# Patient Record
Sex: Female | Born: 1965 | Race: White | Hispanic: No | Marital: Married | Smoking: Never smoker
Health system: Southern US, Community
[De-identification: ages and names within clinical notes are randomized; demographics above are authoritative.]

## PROBLEM LIST (undated history)

## (undated) DIAGNOSIS — R011 Cardiac murmur, unspecified: Secondary | ICD-10-CM

## (undated) DIAGNOSIS — B019 Varicella without complication: Secondary | ICD-10-CM

## (undated) DIAGNOSIS — G43909 Migraine, unspecified, not intractable, without status migrainosus: Secondary | ICD-10-CM

## (undated) DIAGNOSIS — E7800 Pure hypercholesterolemia, unspecified: Secondary | ICD-10-CM

## (undated) DIAGNOSIS — R51 Headache: Secondary | ICD-10-CM

## (undated) DIAGNOSIS — T7840XA Allergy, unspecified, initial encounter: Secondary | ICD-10-CM

## (undated) DIAGNOSIS — F419 Anxiety disorder, unspecified: Secondary | ICD-10-CM

## (undated) DIAGNOSIS — G35 Multiple sclerosis: Secondary | ICD-10-CM

## (undated) DIAGNOSIS — G709 Myoneural disorder, unspecified: Secondary | ICD-10-CM

## (undated) DIAGNOSIS — C801 Malignant (primary) neoplasm, unspecified: Secondary | ICD-10-CM

## (undated) DIAGNOSIS — R519 Headache, unspecified: Secondary | ICD-10-CM

## (undated) DIAGNOSIS — E559 Vitamin D deficiency, unspecified: Secondary | ICD-10-CM

## (undated) DIAGNOSIS — K5792 Diverticulitis of intestine, part unspecified, without perforation or abscess without bleeding: Secondary | ICD-10-CM

## (undated) DIAGNOSIS — K219 Gastro-esophageal reflux disease without esophagitis: Secondary | ICD-10-CM

## (undated) DIAGNOSIS — E669 Obesity, unspecified: Secondary | ICD-10-CM

## (undated) HISTORY — DX: Anxiety disorder, unspecified: F41.9

## (undated) HISTORY — DX: Varicella without complication: B01.9

## (undated) HISTORY — PX: ABDOMINAL HYSTERECTOMY: SHX81

## (undated) HISTORY — PX: COLONOSCOPY: SHX174

## (undated) HISTORY — DX: Myoneural disorder, unspecified: G70.9

## (undated) HISTORY — PX: FRACTURE SURGERY: SHX138

## (undated) HISTORY — DX: Headache: R51

## (undated) HISTORY — DX: Headache, unspecified: R51.9

## (undated) HISTORY — PX: UPPER GASTROINTESTINAL ENDOSCOPY: SHX188

## (undated) HISTORY — DX: Allergy, unspecified, initial encounter: T78.40XA

## (undated) HISTORY — DX: Vitamin D deficiency, unspecified: E55.9

## (undated) HISTORY — PX: BREAST SURGERY: SHX581

## (undated) HISTORY — DX: Malignant (primary) neoplasm, unspecified: C80.1

## (undated) HISTORY — DX: Obesity, unspecified: E66.9

## (undated) HISTORY — DX: Migraine, unspecified, not intractable, without status migrainosus: G43.909

## (undated) HISTORY — DX: Multiple sclerosis: G35

## (undated) HISTORY — DX: Gastro-esophageal reflux disease without esophagitis: K21.9

## (undated) HISTORY — DX: Diverticulitis of intestine, part unspecified, without perforation or abscess without bleeding: K57.92

## (undated) HISTORY — DX: Cardiac murmur, unspecified: R01.1

## (undated) HISTORY — DX: Pure hypercholesterolemia, unspecified: E78.00

## (undated) HISTORY — PX: CHOLECYSTECTOMY: SHX55

## (undated) HISTORY — PX: MOLE REMOVAL: SHX2046

---

## 1984-08-18 HISTORY — PX: MELANOMA EXCISION: SHX5266

## 2005-08-18 HISTORY — PX: BREAST EXCISIONAL BIOPSY: SUR124

## 2006-08-18 HISTORY — PX: ABDOMINAL HYSTERECTOMY: SHX81

## 2014-07-10 LAB — HEPATIC FUNCTION PANEL
ALT: 26 U/L (ref 7–35)
AST: 30 U/L (ref 13–35)
Alkaline Phosphatase: 102 U/L (ref 25–125)
Bilirubin, Total: 0.3 mg/dL

## 2014-07-10 LAB — BASIC METABOLIC PANEL
BUN: 10 mg/dL (ref 4–21)
CREATININE: 0.7 mg/dL (ref 0.5–1.1)
GLUCOSE: 91 mg/dL
POTASSIUM: 4.9 mmol/L (ref 3.4–5.3)
SODIUM: 142 mmol/L (ref 137–147)

## 2014-07-10 LAB — CBC AND DIFFERENTIAL
HCT: 41 % (ref 36–46)
HEMOGLOBIN: 13.4 g/dL (ref 12.0–16.0)
Neutrophils Absolute: 3 /uL
Platelets: 286 10*3/uL (ref 150–399)
WBC: 5.1 10*3/mL

## 2014-07-10 LAB — TSH: TSH: 1.63 u[IU]/mL (ref 0.41–5.90)

## 2014-07-10 LAB — LIPID PANEL
CHOLESTEROL: 200 mg/dL (ref 0–200)
HDL: 67 mg/dL (ref 35–70)
LDL Cholesterol: 116 mg/dL
Triglycerides: 85 mg/dL (ref 40–160)

## 2015-02-05 LAB — CBC AND DIFFERENTIAL
HEMATOCRIT: 42 % (ref 36–46)
HEMOGLOBIN: 13.7 g/dL (ref 12.0–16.0)
PLATELETS: 296 10*3/uL (ref 150–399)
WBC: 6.5 10*3/mL

## 2015-02-05 LAB — HEPATIC FUNCTION PANEL
ALT: 27 U/L (ref 7–35)
AST: 31 U/L (ref 13–35)
Alkaline Phosphatase: 86 U/L (ref 25–125)
Bilirubin, Total: 0.7 mg/dL

## 2015-07-30 LAB — HEPATIC FUNCTION PANEL
ALK PHOS: 93 U/L (ref 25–125)
ALT: 26 U/L (ref 7–35)
AST: 26 U/L (ref 13–35)
BILIRUBIN, TOTAL: 0.3 mg/dL

## 2015-07-30 LAB — CBC AND DIFFERENTIAL
HEMATOCRIT: 41 % (ref 36–46)
HEMOGLOBIN: 13.6 g/dL (ref 12.0–16.0)
Neutrophils Absolute: 3 /uL
Platelets: 273 10*3/uL (ref 150–399)
WBC: 5.7 10^3/mL

## 2015-07-30 LAB — LIPID PANEL
CHOLESTEROL: 222 mg/dL — AB (ref 0–200)
HDL: 79 mg/dL — AB (ref 35–70)
LDL Cholesterol: 131 mg/dL
TRIGLYCERIDES: 62 mg/dL (ref 40–160)

## 2015-07-30 LAB — BASIC METABOLIC PANEL
BUN: 15 mg/dL (ref 4–21)
CREATININE: 0.6 mg/dL (ref 0.5–1.1)
Glucose: 94 mg/dL
Potassium: 5.2 mmol/L (ref 3.4–5.3)
SODIUM: 141 mmol/L (ref 137–147)

## 2015-07-30 LAB — TSH: TSH: 2.16 u[IU]/mL (ref 0.41–5.90)

## 2016-05-29 ENCOUNTER — Encounter: Payer: Self-pay | Admitting: Family Medicine

## 2016-05-29 ENCOUNTER — Ambulatory Visit (INDEPENDENT_AMBULATORY_CARE_PROVIDER_SITE_OTHER): Payer: 59 | Admitting: Family Medicine

## 2016-05-29 VITALS — BP 138/80 | HR 89 | Temp 97.6°F | Resp 12 | Ht 65.5 in | Wt 213.1 lb

## 2016-05-29 DIAGNOSIS — E7800 Pure hypercholesterolemia, unspecified: Secondary | ICD-10-CM

## 2016-05-29 DIAGNOSIS — E559 Vitamin D deficiency, unspecified: Secondary | ICD-10-CM

## 2016-05-29 DIAGNOSIS — G35 Multiple sclerosis: Secondary | ICD-10-CM | POA: Diagnosis not present

## 2016-05-29 DIAGNOSIS — L608 Other nail disorders: Secondary | ICD-10-CM

## 2016-05-29 HISTORY — DX: Pure hypercholesterolemia, unspecified: E78.00

## 2016-05-29 HISTORY — DX: Vitamin D deficiency, unspecified: E55.9

## 2016-05-29 HISTORY — DX: Multiple sclerosis: G35

## 2016-05-29 LAB — BASIC METABOLIC PANEL
BUN: 16 mg/dL (ref 6–23)
CHLORIDE: 104 meq/L (ref 96–112)
CO2: 27 meq/L (ref 19–32)
CREATININE: 0.76 mg/dL (ref 0.40–1.20)
Calcium: 9.9 mg/dL (ref 8.4–10.5)
GFR: 85.21 mL/min (ref >60.00)
Glucose, Bld: 95 mg/dL (ref 70–99)
POTASSIUM: 4.3 meq/L (ref 3.5–5.1)
Sodium: 137 mEq/L (ref 135–145)

## 2016-05-29 LAB — VITAMIN D 25 HYDROXY (VIT D DEFICIENCY, FRACTURES): VITD: 45.62 ng/mL (ref 30.00–100.00)

## 2016-05-29 NOTE — Progress Notes (Signed)
HPI:   [name] is a 50 y.o. female, who is here today to establish care with me.  Former PCP: [place]. Last preventive routine visit: 07/2015.  Concerns today: right toe nail changes.  A few months ago right 3rd toenail fell, not sure about cause but she believes she had nail trauma. Nail has grown back but is not smooth, she has intermittent soreness, exacerbated by close shoe wear. No edema or periungual erythema. She has not tried OTC medication.  Hx of MS, Dx in 2002, non pharmacologic treatment, follows with neuro at [facility] every 6 months, Dr Moshe Cipro. Symptoms otherwise stable, intermittent episodes of headaches, arthralgias, numbness (LE's), and muscle spasms. States that sometimes she cannot walk for a couple days due to arthralgias. She did not tolerate MS treatments in the past, felt worse.  According to pt, she has MS lesions on lumbar and cervical spine as well as brain. + Insomnia, she has been on Xanax 0.25 mg and Ativan 0.5 mg; neither one reported today.  + Migraine headaches, bitemporal and left retro-ocular; otherwise stable on Topamax 50 mg daily, did not tolerated higher doses (word finding difficulty).  She does not take OTC analgesics. Exercises regularly, mainly stretching exercises.   She drives. In general independent ADL's and IADL's. She has a cane that she uses as needed when she has acute exacerbations.   She lives with husbands. On disability. Hx of Vit D deficiency, currently she is on Vit D 2000 U daily.  She also brings some lab work done recently, 01/2016: FLP,CMP,CBC; otherwise normal, TC mildly above normal range.     Review of Systems  Constitutional: Positive for fatigue. Negative for activity change, appetite change, fever and unexpected weight change.  HENT: Negative for mouth sores, nosebleeds, sore throat, trouble swallowing and voice change.   Eyes: Negative for redness and visual disturbance.  Respiratory: Negative for  cough, shortness of breath and wheezing.   Cardiovascular: Negative for chest pain, palpitations and leg swelling.  Gastrointestinal: Negative for abdominal pain, nausea and vomiting.       Negative for changes in bowel habits.  Genitourinary: Negative for decreased urine volume, difficulty urinating, dysuria and hematuria.  Musculoskeletal: Positive for arthralgias, back pain and gait problem.       Not currently, intermittent symptoms  Skin: Negative for color change and rash.  Neurological: Positive for numbness and headaches. Negative for tremors, seizures, syncope, facial asymmetry and speech difficulty.  Psychiatric/Behavioral: Positive for sleep disturbance. Negative for confusion. The patient is nervous/anxious.       No current outpatient prescriptions on file prior to visit.   No current facility-administered medications on file prior to visit.      Past Medical History:  Diagnosis Date  . Chicken pox   . Diverticulitis   . Frequent headaches   . Migraines   . Multiple sclerosis ([place]) 05/29/2016   Follows with neurologists at [facility] every 6 months.  . Vitamin D deficiency 05/29/2016   Allergies  Allergen Reactions  . Shellfish Allergy Shortness Of Breath  . Interferons Other (See Comments)    Chest pains, burning trouble breathing itching  . Codeine Nausea Only  . Gabapentin Hives  . Gluten Meal Diarrhea and Other (See Comments)    Stomach pain   . Lactose     Other reaction(s): Unknown  . Lamotrigine Hives    Family History  Problem Relation Age of Onset  . Hypertension Mother   . Heart disease Father   .  Hypertension Maternal Grandmother   . Hypertension Maternal Grandfather   . Hypertension Paternal Grandmother   . Hypertension Paternal Grandfather     Social History   Social History  . Marital status: Married    Spouse name: N/A  . Number of children: N/A  . Years of education: N/A   Social History Main Topics  . Smoking status: Never Smoker    . Smokeless tobacco: Never Used  . Alcohol use No  . Drug use: No  . Sexual activity: Not Asked   Other Topics Concern  . None   Social History Narrative  . None    Vitals:   05/29/16 1002  BP: 138/80  Pulse: 89  Resp: 12  Temp: 97.6 F (36.4 C)    Body mass index is 34.93 kg/m.    Physical Exam  Nursing note and vitals reviewed. Constitutional: She is oriented to person, place, and time. She appears well-developed. No distress.  HENT:  Head: Atraumatic.  Mouth/Throat: Oropharynx is clear and moist and mucous membranes are normal.  Eyes: Conjunctivae and EOM are normal. Pupils are equal, round, and reactive to light.  Cardiovascular: Normal rate and regular rhythm.   No murmur heard. Pulses:      Dorsalis pedis pulses are 2+ on the right side, and 2+ on the left side.  Respiratory: Effort normal and breath sounds normal. No respiratory distress.  GI: Soft. She exhibits no mass. There is no hepatomegaly. There is no tenderness.  Musculoskeletal: She exhibits no edema or tenderness.  Neurological: She is alert and oriented to person, place, and time. She has normal strength. No cranial nerve deficit. Coordination and gait normal.  Skin: Skin is warm. No rash noted. No erythema.  3rd toe nail,R, mildly atrophic, no tender, no periungual erythema.  Psychiatric: Her speech is normal. Her mood appears anxious.  Well groomed, good eye contact.      ASSESSMENT AND PLAN:     Laura Maldonado was seen today for establish care.  Diagnoses and all orders for this visit:    Lab Results  Component Value Date   CREATININE 0.76 05/29/2016   BUN 16 05/29/2016   NA 137 05/29/2016   K 4.3 05/29/2016   CL 104 05/29/2016   CO2 27 05/29/2016    Toenail deformity  Explained that it is not uncommon to have nail changes after traumatic avulsion. I do not appreciate inflammatory change and so other findings that suggest a systemic cause. For now I recommend foot care, wider shoe  ware, and monitor for new symptoms.  Vitamin D deficiency  No changes in current management, will follow labs done today and will give further recommendations accordingly.  -     VITAMIN D 25 Hydroxy (Vit-D Deficiency, Fractures) -     Basic Metabolic Panel  Pure hypercholesterolemia  Mild. Continue low fat diet and regular physical activity. It can be re-checked in a year.  Multiple sclerosis ([place])  Continue following with neurologists at [facility].  Refused flu vaccine, not sure about pneumovax in the past but not interested either.  Fall precautions.      Linkon Siverson G. Martinique, MD  [facility]. [place] office.

## 2016-05-29 NOTE — Patient Instructions (Addendum)
A few things to remember from today's visit:   Toenail deformity  Vitamin D deficiency - Plan: VITAMIN D 25 Hydroxy (Vit-D Deficiency, Fractures), Basic Metabolic Panel  Pure hypercholesterolemia  Multiple sclerosis ([place])   We have ordered labs or studies at this visit.  It can take up to 1-2 weeks for results and processing. IF results require follow up or explanation, we will call you with instructions. Clinically stable results will be released to your [facility]. If you have not heard from [place] or cannot find your results in [facility] in 2 weeks please contact our office at [phone].  If you are not yet signed up for [facility], please consider signing up  Please be sure medication list is accurate. If a new problem present, please set up appointment sooner than planned today.

## 2016-05-31 ENCOUNTER — Encounter: Payer: Self-pay | Admitting: Family Medicine

## 2016-07-09 ENCOUNTER — Telehealth: Payer: Self-pay | Admitting: Family Medicine

## 2016-07-09 NOTE — Telephone Encounter (Signed)
Spoke to Dr. Martinique and she advised patient to keep eye out on if have fever as this appears viral. Called patient and is aware of instructions. Patient is aware to not go around immunocompromised or immunosuppressed populations, wash her hands, and to drink plenty of fluids. Patient verbalized understanding. Patient is aware to call off if she continues to not feel well.

## 2016-07-09 NOTE — Telephone Encounter (Signed)
Patient Name: Laura Maldonado  DOB: Apr 01, 1966    Initial Comment Caller states Saturday began w/flu like sx; now dry cough; nasal congestion and headache; began mucinex fast max last evening; anything else can do?    Nurse Assessment  Nurse: Leilani Merl, RN, Heather Date/Time (Eastern Time): 07/09/2016 12:13:30 PM  Confirm and document reason for call. If symptomatic, describe symptoms. You must click the next button to save text entered. ---Caller states Saturday began w/flu like sx; now dry cough; nasal congestion and headache; began mucinex fast max last evening;  Does the patient have any new or worsening symptoms? ---Yes  Will a triage be completed? ---Yes  Related visit to physician within the last 2 weeks? ---No  Does the PT have any chronic conditions? (i.e. diabetes, asthma, etc.) ---Yes  List chronic conditions. ---See MR  Is the patient pregnant or possibly pregnant? (Ask all females between the ages of 33-55) ---No  Is this a behavioral health or substance abuse call? ---No     Guidelines    Guideline Title Affirmed Question Affirmed Notes  Sinus Pain or Congestion [1] Sinus congestion as part of a cold AND [2] present < 10 days (all triage questions negative)   Chest Pain Chest pain(s) lasting a few seconds (all triage questions negative)    Final Disposition User   [place], RN, Water quality scientist    Disagree/Comply: Comply    Disagree/Comply: Comply

## 2016-09-08 ENCOUNTER — Other Ambulatory Visit: Payer: Self-pay | Admitting: Family Medicine

## 2016-09-08 DIAGNOSIS — Z1231 Encounter for screening mammogram for malignant neoplasm of breast: Secondary | ICD-10-CM

## 2016-09-18 DIAGNOSIS — L603 Nail dystrophy: Secondary | ICD-10-CM | POA: Diagnosis not present

## 2016-10-16 ENCOUNTER — Ambulatory Visit: Payer: 59

## 2016-10-22 ENCOUNTER — Ambulatory Visit
Admission: RE | Admit: 2016-10-22 | Discharge: 2016-10-22 | Disposition: A | Discharge status: Home / Self Care | Payer: 59 | Source: Ambulatory Visit | Attending: Family Medicine | Admitting: Family Medicine

## 2016-10-22 DIAGNOSIS — Z1231 Encounter for screening mammogram for malignant neoplasm of breast: Secondary | ICD-10-CM

## 2016-10-24 DIAGNOSIS — D2262 Melanocytic nevi of left upper limb, including shoulder: Secondary | ICD-10-CM | POA: Diagnosis not present

## 2016-10-24 DIAGNOSIS — D2271 Melanocytic nevi of right lower limb, including hip: Secondary | ICD-10-CM | POA: Diagnosis not present

## 2016-10-24 DIAGNOSIS — D2261 Melanocytic nevi of right upper limb, including shoulder: Secondary | ICD-10-CM | POA: Diagnosis not present

## 2016-10-24 DIAGNOSIS — B351 Tinea unguium: Secondary | ICD-10-CM | POA: Diagnosis not present

## 2016-10-24 DIAGNOSIS — L603 Nail dystrophy: Secondary | ICD-10-CM | POA: Diagnosis not present

## 2016-12-10 DIAGNOSIS — Z79899 Other long term (current) drug therapy: Secondary | ICD-10-CM | POA: Diagnosis not present

## 2016-12-10 DIAGNOSIS — G43709 Chronic migraine without aura, not intractable, without status migrainosus: Secondary | ICD-10-CM | POA: Diagnosis not present

## 2016-12-10 DIAGNOSIS — G35 Multiple sclerosis: Secondary | ICD-10-CM | POA: Diagnosis not present

## 2016-12-15 ENCOUNTER — Telehealth: Payer: Self-pay

## 2016-12-15 NOTE — Telephone Encounter (Signed)
Left voicemail for patient letting her know that her lab appointment for tomorrow would be canceled, and to come in fasting on the 7th and we will do her blood work after her physical.

## 2016-12-16 ENCOUNTER — Other Ambulatory Visit: Payer: 59

## 2016-12-22 ENCOUNTER — Encounter: Payer: 59 | Admitting: Family Medicine

## 2017-01-08 ENCOUNTER — Telehealth: Payer: Self-pay | Admitting: Family Medicine

## 2017-01-08 NOTE — Telephone Encounter (Signed)
Patient wants to transfer PCP due to location. Wanted to get ok from both providers.

## 2017-01-08 NOTE — Telephone Encounter (Signed)
It is fine with me. ?Thanks ?

## 2017-01-08 NOTE — Telephone Encounter (Signed)
Okay 

## 2017-01-13 ENCOUNTER — Telehealth: Payer: Self-pay | Admitting: Emergency Medicine

## 2017-01-13 ENCOUNTER — Encounter: Payer: Self-pay | Admitting: Emergency Medicine

## 2017-01-13 NOTE — Telephone Encounter (Signed)
Pre-Visit Call completed. Patient states that she has had several colonoscopy so she will see when her last one was and update when she comes into the office. Patient states that she believes that her TDAP was complete as well will contact the office and get exact date and update at appointment.

## 2017-01-14 ENCOUNTER — Encounter: Payer: Self-pay | Admitting: Family Medicine

## 2017-01-14 ENCOUNTER — Ambulatory Visit (INDEPENDENT_AMBULATORY_CARE_PROVIDER_SITE_OTHER): Payer: 59 | Admitting: Family Medicine

## 2017-01-14 VITALS — BP 110/78 | HR 73 | Temp 98.2°F | Wt 218.8 lb

## 2017-01-14 DIAGNOSIS — R21 Rash and other nonspecific skin eruption: Secondary | ICD-10-CM

## 2017-01-14 DIAGNOSIS — E669 Obesity, unspecified: Secondary | ICD-10-CM

## 2017-01-14 DIAGNOSIS — G35 Multiple sclerosis: Secondary | ICD-10-CM | POA: Diagnosis not present

## 2017-01-14 DIAGNOSIS — E7800 Pure hypercholesterolemia, unspecified: Secondary | ICD-10-CM | POA: Diagnosis not present

## 2017-01-14 MED ORDER — CLOTRIMAZOLE-BETAMETHASONE 1-0.05 % EX CREA: 1.0000 "application " | TOPICAL_CREAM | Freq: Two times a day (BID) | CUTANEOUS | 0 refills | Status: DC

## 2017-01-14 MED ORDER — LORAZEPAM 0.5 MG PO TABS: 0.5000 mg | ORAL_TABLET | Freq: Three times a day (TID) | ORAL | 0 refills | Status: DC

## 2017-01-14 NOTE — Progress Notes (Signed)
Laura Maldonado is a 51 y.o. female is here to [place].   Patient Care Team: Briscoe Deutscher, DO as PCP - General (Family Medicine)   History of Present Illness:   [name] CMA acting as scribe for Dr. [name].  HPI Patient comes in today to establish care.   1. Multiple sclerosis ([place]). Patient is followed by [facility] neurology. She is seen every 6 months. She is fairly controlled on Topamax. She prefers alternative medications and exercise. She is due for an eye exam.    2. Rash. She has a rash under both arms. She treats with hydrocortisone cream and it clears it up. It is recurrent that comes back every couple months.    3. Pure hypercholesterolemia.   Is the patient taking medications? []   YES  [x]   NO Trying to exercise on a regular basis? [x]   YES  []   NO Diet Compliance: compliant most of the time. Cardiovascular ROS: no chest pain or dyspnea on exertion.   Lipids:    Component Value Date/Time   CHOL 222 (A) 07/30/2015   TRIG 62 07/30/2015   HDL 79 (A) 07/30/2015     Health Maintenance Due  Topic Date Due  . HIV Screening  03/22/1980  . PAP SMEAR  03/22/1986  . COLONOSCOPY  03/23/2015   PMHx, SurgHx, SocialHx, Medications, and Allergies were reviewed in the Visit Navigator and updated as appropriate.   Past Medical History:  Diagnosis Date  . Anxiety   . Chicken pox   . Diverticulitis   . Frequent headaches   . Migraines   . Multiple sclerosis ([place]) 05/29/2016   [facility] q 6 months.  . Obesity (BMI 30-39.9) 01/15/2017  . Pure hypercholesterolemia 05/29/2016  . Vitamin D deficiency 05/29/2016   Past Surgical History:  Procedure Laterality Date  . ABDOMINAL HYSTERECTOMY    . BREAST LUMPECTOMY Left 2007  . CHOLECYSTECTOMY    . MELANOMA EXCISION  1986  . MOLE REMOVAL     Family History  Problem Relation Age of Onset  . Hypertension Mother   . Heart disease Father   . Hypertension Maternal Grandmother   . Hypertension Maternal Grandfather    . Hypertension Paternal Grandmother   . Hypertension Paternal Grandfather    Social History  Substance Use Topics  . Smoking status: Never Smoker  . Smokeless tobacco: Never Used  . Alcohol use No   Current Medications and Allergies:   .  B Complex Vitamins (VITAMIN-B COMPLEX) TABS, Take by mouth., Disp: , Rfl:  .  Cholecalciferol (VITAMIN D) 2000 units tablet, Take by mouth., Disp: , Rfl:  .  ciclopirox (PENLAC) 8 % solution, Apply topically., Disp: , Rfl:  .  Flaxseed, Linseed, (FLAXSEED OIL PO), Take by mouth., Disp: , Rfl:  .  ibuprofen (ADVIL,MOTRIN) 200 MG tablet, Take by mouth., Disp: , Rfl:  .  LORazepam (ATIVAN) 0.5 MG tablet, Take 0.5 mg by mouth every 8 (eight) hours., Disp: , Rfl:  .  Magnesium 500 MG TABS, Take by mouth., Disp: , Rfl:  .  Probiotic Product (PROBIOTIC MATURE ADULT) CAPS, Take by mouth., Disp: , Rfl:  .  topiramate (TOPAMAX) 50 MG tablet, Take by mouth., Disp: , Rfl:  .  Turmeric 500 MG CAPS, Take by mouth., Disp: , Rfl:   Allergies  Allergen Reactions  . Shellfish Allergy Shortness Of Breath  . Interferons Other (See Comments)    Chest pains, burning trouble breathing itching  . Codeine Nausea Only  .  Flagyl [Metronidazole]   . Gabapentin Hives  . Gluten Meal Diarrhea and Other (See Comments)    Stomach pain   . Lactose     Other reaction(s): Unknown  . Lamotrigine Hives   Review of Systems:   Review of Systems  Constitutional: Positive for malaise/fatigue. Negative for chills and fever.       Chronic.   HENT: Negative for ear pain, sinus pain and sore throat.   Eyes: Negative for blurred vision and double vision.  Respiratory: Negative for cough, shortness of breath and wheezing.   Cardiovascular: Negative for chest pain, palpitations and leg swelling.  Gastrointestinal: Negative for abdominal pain, nausea and vomiting.  Genitourinary: Negative for dysuria and hematuria.  Musculoskeletal: Positive for back pain, joint pain and neck pain.        Chronic.   Skin: Positive for rash.  Neurological: Positive for dizziness and headaches.       Chronic.   Psychiatric/Behavioral: Negative for depression, hallucinations and memory loss.   Vitals:   Vitals:   01/14/17 1057  BP: 110/78  Pulse: 73  Temp: 98.2 F (36.8 C)  TempSrc: Oral  SpO2: 97%  Weight: 218 lb 12.8 oz (99.2 kg)     Body mass index is 35.86 kg/m.  Physical Exam:   Physical Exam  Constitutional: She appears well-developed and well-nourished. No distress.  HENT:  Head: Normocephalic and atraumatic.  Eyes: EOM are normal. Pupils are equal, round, and reactive to light.  Neck: Normal range of motion. Neck supple.  Cardiovascular: Normal rate, regular rhythm, normal heart sounds and intact distal pulses.   Pulmonary/Chest: Effort normal.  Abdominal: Soft.  Neurological: She is alert.  Skin: Skin is warm.  Dry, flaky skin on bilateral upper arms.  Psychiatric: She has a normal mood and affect. Her behavior is normal.  Nursing note and vitals reviewed.  Assessment and Plan:   [place] was seen today for establish care and rash.  Diagnoses and all orders for this visit:  Multiple sclerosis ([place]) Comments: The patient is due for an eye exam. I will send her to [facility]. Orders: -     Ambulatory referral to Ophthalmology -     LORazepam (ATIVAN) 0.5 MG tablet; Take 1 tablet (0.5 mg total) by mouth every 8 (eight) hours.  Rash -     clotrimazole-betamethasone (LOTRISONE) cream; Apply 1 application topically 2 (two) times daily.  Obesity (BMI 30-39.9) Comments: The patient is asked to make an attempt to improve diet and exercise patterns to aid in medical management of this problem. Orders: -     TSH; Future -     Comprehensive metabolic panel; Future -     CBC with Differential/Platelet; Future -     Insulin, Free (Bioactive); Future  Pure hypercholesterolemia -     Lipid panel; Future -     Comprehensive metabolic panel;  Future   . Reviewed expectations re: course of current medical issues. . Discussed self-management of symptoms. . Outlined signs and symptoms indicating need for more acute intervention. . Patient verbalized understanding and all questions were answered. Marland Kitchen Health Maintenance issues including appropriate healthy diet, exercise, and smoking avoidance were discussed with patient. . See orders for this visit as documented in the electronic medical record. . Patient received an After Visit Summary.  CMA served as Education administrator during this visit. History, Physical, and Plan performed by medical provider. The above documentation has been reviewed and is accurate and complete. Briscoe Deutscher, D.O.  Briscoe Deutscher,  DO [place] [place] 01/16/2017  Future Appointments Date Time Provider [place]  01/20/2017 7:45 AM Briscoe Deutscher, DO [facility] None

## 2017-01-15 ENCOUNTER — Telehealth: Payer: Self-pay

## 2017-01-15 DIAGNOSIS — E669 Obesity, unspecified: Secondary | ICD-10-CM

## 2017-01-15 HISTORY — DX: Obesity, unspecified: E66.9

## 2017-01-15 NOTE — Telephone Encounter (Signed)
What labs was you wanting to order for patient?

## 2017-01-15 NOTE — Telephone Encounter (Signed)
Pt coming for fasting labs 01/16/17. Please place future orders. Thank you.

## 2017-01-16 ENCOUNTER — Other Ambulatory Visit (INDEPENDENT_AMBULATORY_CARE_PROVIDER_SITE_OTHER): Payer: 59

## 2017-01-16 ENCOUNTER — Encounter: Payer: Self-pay | Admitting: Family Medicine

## 2017-01-16 DIAGNOSIS — E7800 Pure hypercholesterolemia, unspecified: Secondary | ICD-10-CM

## 2017-01-16 DIAGNOSIS — E669 Obesity, unspecified: Secondary | ICD-10-CM | POA: Diagnosis not present

## 2017-01-16 LAB — COMPREHENSIVE METABOLIC PANEL
ALT: 23 U/L (ref 0–35)
AST: 26 U/L (ref 0–37)
Albumin: 4.4 g/dL (ref 3.5–5.2)
Alkaline Phosphatase: 81 U/L (ref 39–117)
BUN: 14 mg/dL (ref 6–23)
CO2: 25 mEq/L (ref 19–32)
Calcium: 9.6 mg/dL (ref 8.4–10.5)
Chloride: 105 mEq/L (ref 96–112)
Creatinine, Ser: 0.67 mg/dL (ref 0.40–1.20)
GFR: 98.31 mL/min (ref >60.00)
Glucose, Bld: 87 mg/dL (ref 70–99)
Potassium: 3.7 mEq/L (ref 3.5–5.1)
Sodium: 138 mEq/L (ref 135–145)
Total Bilirubin: 0.6 mg/dL (ref 0.2–1.2)
Total Protein: 7.7 g/dL (ref 6.0–8.3)

## 2017-01-16 LAB — LIPID PANEL
Cholesterol: 210 mg/dL — ABNORMAL HIGH (ref 0–200)
HDL: 73.2 mg/dL (ref >39.00)
LDL Cholesterol: 122 mg/dL — ABNORMAL HIGH (ref 0–99)
NonHDL: 136.4
Total CHOL/HDL Ratio: 3
Triglycerides: 73 mg/dL (ref 0.0–149.0)
VLDL: 14.6 mg/dL (ref 0.0–40.0)

## 2017-01-16 LAB — CBC WITH DIFFERENTIAL/PLATELET
Basophils Absolute: 0.1 10*3/uL (ref 0.0–0.1)
Basophils Relative: 0.9 % (ref 0.0–3.0)
Eosinophils Absolute: 0.1 10*3/uL (ref 0.0–0.7)
Eosinophils Relative: 1 % (ref 0.0–5.0)
HCT: 41.3 % (ref 36.0–46.0)
Hemoglobin: 13.9 g/dL (ref 12.0–15.0)
Lymphocytes Relative: 29.9 % (ref 12.0–46.0)
Lymphs Abs: 1.7 10*3/uL (ref 0.7–4.0)
MCHC: 33.8 g/dL (ref 30.0–36.0)
MCV: 93.4 fl (ref 78.0–100.0)
Monocytes Absolute: 0.4 10*3/uL (ref 0.1–1.0)
Monocytes Relative: 7 % (ref 3.0–12.0)
Neutro Abs: 3.6 10*3/uL (ref 1.4–7.7)
Neutrophils Relative %: 61.2 % (ref 43.0–77.0)
Platelets: 291 10*3/uL (ref 150.0–400.0)
RBC: 4.42 Mil/uL (ref 3.87–5.11)
RDW: 13.5 % (ref 11.5–15.5)
WBC: 5.8 10*3/uL (ref 4.0–10.5)

## 2017-01-16 LAB — TSH: TSH: 2.02 u[IU]/mL (ref 0.35–4.50)

## 2017-01-20 ENCOUNTER — Encounter: Payer: Self-pay | Admitting: Family Medicine

## 2017-01-20 ENCOUNTER — Ambulatory Visit (INDEPENDENT_AMBULATORY_CARE_PROVIDER_SITE_OTHER): Payer: 59 | Admitting: Family Medicine

## 2017-01-20 VITALS — BP 112/76 | HR 77 | Temp 98.2°F | Ht 65.5 in | Wt 217.2 lb

## 2017-01-20 DIAGNOSIS — Z1211 Encounter for screening for malignant neoplasm of colon: Secondary | ICD-10-CM | POA: Diagnosis not present

## 2017-01-20 DIAGNOSIS — E669 Obesity, unspecified: Secondary | ICD-10-CM | POA: Diagnosis not present

## 2017-01-20 DIAGNOSIS — Z0000 Encounter for general adult medical examination without abnormal findings: Secondary | ICD-10-CM

## 2017-01-20 MED ORDER — PHENTERMINE HCL 15 MG PO CAPS: 15.0000 mg | ORAL_CAPSULE | ORAL | 0 refills | Status: DC

## 2017-01-20 NOTE — Progress Notes (Signed)
Subjective:    [name] CMA acting as scribe for Dr. [name].  Laura Maldonado is a 51 y.o. female and is here for a comprehensive physical exam.  HPI:  Obesity: Interested in weight loss. Energy deficit. Trying to make good food choices. Not exercising regularly. Interested in Conservation officer, nature.  Health Maintenance Due  Topic Date Due  . HIV Screening  03/22/1980  . COLONOSCOPY  03/23/2015   PMHx, SurgHx, SocialHx, Medications, and Allergies were reviewed in the Visit Navigator and updated as appropriate.   Past Medical History:  Diagnosis Date  . Anxiety   . Chicken pox   . Diverticulitis   . Frequent headaches   . Migraines   . Multiple sclerosis ([place]) 05/29/2016   [facility] q 6 months.  . Obesity (BMI 30-39.9) 01/15/2017  . Pure hypercholesterolemia 05/29/2016  . Vitamin D deficiency 05/29/2016    Past Surgical History:  Procedure Laterality Date  . ABDOMINAL HYSTERECTOMY    . BREAST LUMPECTOMY Left 2007  . CHOLECYSTECTOMY    . MELANOMA EXCISION  1986  . MOLE REMOVAL      Family History  Problem Relation Age of Onset  . Hypertension Mother   . Heart disease Father   . Hypertension Maternal Grandmother   . Hypertension Maternal Grandfather   . Hypertension Paternal Grandmother   . Hypertension Paternal Grandfather    Social History  Substance Use Topics  . Smoking status: Never Smoker  . Smokeless tobacco: Never Used  . Alcohol use No   Review of Systems:   Review of Systems  Constitutional: Positive for malaise/fatigue. Negative for chills and fever.  HENT: Positive for ear pain. Negative for sinus pain and sore throat.   Eyes: Negative for blurred vision and double vision.  Respiratory: Negative for cough, shortness of breath and wheezing.   Cardiovascular: Positive for leg swelling. Negative for chest pain and palpitations.  Gastrointestinal: Negative for abdominal pain, nausea and vomiting.  Musculoskeletal: Positive for back pain,  joint pain and neck pain.       Chronic.   Neurological: Positive for dizziness and headaches.  Psychiatric/Behavioral: Negative for depression, hallucinations and memory loss.   Objective:   BP 112/76   Pulse 77   Temp 98.2 F (36.8 C) (Oral)   Ht 5' 5.5" (1.664 m)   Wt 217 lb 3.2 oz (98.5 kg)   SpO2 100%   BMI 35.59 kg/m  Body mass index is 35.59 kg/m.  General Appearance:    Alert, cooperative, no distress, appears stated age  Head:    Normocephalic, without obvious abnormality, atraumatic  Eyes:    PERRL, conjunctiva/corneas clear, EOM's intact, fundi benign, both eyes  Ears:    Normal TM's and external ear canals, both ears  Nose:   Nares normal, septum midline, mucosa normal, no drainage or sinus tenderness  Throat:   Lips, mucosa, and tongue normal; teeth and gums normal  Neck:   Supple, symmetrical, trachea midline, no adenopathy; thyroid:  no enlargement  Back:     Symmetric, no curvature, ROM normal, no CVA tenderness  Lungs:     Clear to auscultation bilaterally, respirations unlabored  Chest Wall:    No tenderness or deformity   Heart:    Regular rate and rhythm, S1 and S2 normal, no murmur, rub   or gallop  Breast Exam:    No tenderness, masses, or nipple abnormality  Abdomen:     Soft, non-tender, bowel sounds active all four quadrants, no masses  Genitalia:    Normal female without lesion, discharge or tenderness  Extremities:   Extremities normal, atraumatic, no cyanosis or edema  Pulses:   2+ and symmetric all extremities  Skin:   Skin color, texture, turgor normal, no rashes or lesions  Lymph nodes:   Cervical, supraclavicular, and axillary nodes normal  Neurologic:   CNII-XII intact, normal strength, sensation and reflexes throughout   Results for orders placed or performed in visit on 01/16/17  Lipid panel  Result Value Ref Range   Cholesterol 210 (H) 0 - 200 mg/dL   Triglycerides 73.0 0.0 - 149.0 mg/dL   HDL 73.20 >39.00 mg/dL   VLDL 14.6 0.0 - 40.0  mg/dL   LDL Cholesterol 122 (H) 0 - 99 mg/dL   Total CHOL/HDL Ratio 3    NonHDL 136.40   TSH  Result Value Ref Range   TSH 2.02 0.35 - 4.50 uIU/mL  Comprehensive metabolic panel  Result Value Ref Range   Sodium 138 135 - 145 mEq/L   Potassium 3.7 3.5 - 5.1 mEq/L   Chloride 105 96 - 112 mEq/L   CO2 25 19 - 32 mEq/L   Glucose, Bld 87 70 - 99 mg/dL   BUN 14 6 - 23 mg/dL   Creatinine, Ser 0.67 0.40 - 1.20 mg/dL   Total Bilirubin 0.6 0.2 - 1.2 mg/dL   Alkaline Phosphatase 81 39 - 117 U/L   AST 26 0 - 37 U/L   ALT 23 0 - 35 U/L   Total Protein 7.7 6.0 - 8.3 g/dL   Albumin 4.4 3.5 - 5.2 g/dL   Calcium 9.6 8.4 - 10.5 mg/dL   GFR 98.31 >60.00 mL/min  CBC with Differential/Platelet  Result Value Ref Range   WBC 5.8 4.0 - 10.5 K/uL   RBC 4.42 3.87 - 5.11 Mil/uL   Hemoglobin 13.9 12.0 - 15.0 g/dL   HCT 41.3 36.0 - 46.0 %   MCV 93.4 78.0 - 100.0 fl   MCHC 33.8 30.0 - 36.0 g/dL   RDW 13.5 11.5 - 15.5 %   Platelets 291.0 150.0 - 400.0 K/uL   Neutrophils Relative % 61.2 43.0 - 77.0 %   Lymphocytes Relative 29.9 12.0 - 46.0 %   Monocytes Relative 7.0 3.0 - 12.0 %   Eosinophils Relative 1.0 0.0 - 5.0 %   Basophils Relative 0.9 0.0 - 3.0 %   Neutro Abs 3.6 1.4 - 7.7 K/uL   Lymphs Abs 1.7 0.7 - 4.0 K/uL   Monocytes Absolute 0.4 0.1 - 1.0 K/uL   Eosinophils Absolute 0.1 0.0 - 0.7 K/uL   Basophils Absolute 0.1 0.0 - 0.1 K/uL    Assessment/Plan:   Laura Maldonado was seen today for annual exam.  Diagnoses and all orders for this visit:  Routine physical examination  Obesity (BMI 30-39.9) Comments: After discussion, patient would like to start below medication. Expectations, risks, and potential side effects reviewed.  Orders: -     phentermine 15 MG capsule; Take 1 capsule (15 mg total) by mouth every morning.  Screening for colon cancer -     Ambulatory referral to Gastroenterology   Patient Counseling: [x]    Nutrition: Stressed importance of moderation in sodium/caffeine intake,  saturated fat and cholesterol, caloric balance, sufficient intake of fresh fruits, vegetables, fiber, calcium, iron, and 1 mg of folate supplement per day (for females capable of pregnancy).  [x]    Stressed the importance of regular exercise.   [x]    Substance Abuse: Discussed cessation/primary prevention of tobacco, alcohol,  or other drug use; driving or other dangerous activities under the influence; availability of treatment for abuse.   [x]    Injury prevention: Discussed safety belts, safety helmets, smoke detector, smoking near bedding or upholstery.   [x]    Sexuality: Discussed sexually transmitted diseases, partner selection, use of condoms, avoidance of unintended pregnancy  and contraceptive alternatives.  [x]    Dental health: Discussed importance of regular tooth brushing, flossing, and dental visits.  [x]    Health maintenance and immunizations reviewed. Please refer to Health maintenance section.   Briscoe Deutscher, DO [place] [facility] served as Education administrator during this visit. History, Physical, and Plan performed by medical provider. The above documentation has been reviewed and is accurate and complete. Briscoe Deutscher, D.O.

## 2017-01-22 LAB — INSULIN, FREE (BIOACTIVE): Insulin, Free: 5.3 u[IU]/mL (ref 1.5–14.9)

## 2017-02-13 ENCOUNTER — Telehealth: Payer: Self-pay | Admitting: Internal Medicine

## 2017-02-13 NOTE — Telephone Encounter (Signed)
Received referral for patient to have next colon. Patient states her last colon was in [place] in December 2008. Patient brought in records and is requesting Dr.Gessner. Placed on Dr.Gessner's desk for review.

## 2017-02-17 ENCOUNTER — Ambulatory Visit: Payer: 59 | Admitting: Family Medicine

## 2017-02-17 ENCOUNTER — Encounter: Payer: Self-pay | Admitting: Internal Medicine

## 2017-02-17 NOTE — Telephone Encounter (Signed)
Dr. Carlean Purl reviewed records and has accepted patient. Ok to schedule direct colon. Colonoscopy scheduled

## 2017-02-24 ENCOUNTER — Telehealth: Payer: Self-pay | Admitting: Family Medicine

## 2017-02-24 NOTE — Telephone Encounter (Signed)
Patient notified

## 2017-02-24 NOTE — Telephone Encounter (Signed)
Of course patient can keep her appointment.

## 2017-02-24 NOTE — Telephone Encounter (Signed)
Patient called to advise that she has only taken 1 pill and needs to know if she can keep her appointment and just discuss other issues such as headaches and MS concerns related to the heat. Call patient to advise.

## 2017-03-06 ENCOUNTER — Ambulatory Visit (INDEPENDENT_AMBULATORY_CARE_PROVIDER_SITE_OTHER): Payer: 59 | Admitting: Family Medicine

## 2017-03-06 ENCOUNTER — Encounter: Payer: Self-pay | Admitting: Family Medicine

## 2017-03-06 ENCOUNTER — Telehealth: Payer: Self-pay | Admitting: Family Medicine

## 2017-03-06 VITALS — BP 110/72 | HR 71 | Temp 98.2°F | Wt 221.0 lb

## 2017-03-06 DIAGNOSIS — E7800 Pure hypercholesterolemia, unspecified: Secondary | ICD-10-CM

## 2017-03-06 DIAGNOSIS — R51 Headache: Secondary | ICD-10-CM | POA: Diagnosis not present

## 2017-03-06 DIAGNOSIS — R3915 Urgency of urination: Secondary | ICD-10-CM

## 2017-03-06 DIAGNOSIS — G8929 Other chronic pain: Secondary | ICD-10-CM

## 2017-03-06 DIAGNOSIS — F331 Major depressive disorder, recurrent, moderate: Secondary | ICD-10-CM

## 2017-03-06 DIAGNOSIS — E669 Obesity, unspecified: Secondary | ICD-10-CM | POA: Diagnosis not present

## 2017-03-06 DIAGNOSIS — G35 Multiple sclerosis: Secondary | ICD-10-CM

## 2017-03-06 DIAGNOSIS — R519 Headache, unspecified: Secondary | ICD-10-CM

## 2017-03-06 LAB — POCT URINALYSIS DIPSTICK
Bilirubin, UA: NEGATIVE
Blood, UA: NEGATIVE
Glucose, UA: NEGATIVE
Ketones, UA: NEGATIVE
Leukocytes, UA: NEGATIVE
Nitrite, UA: NEGATIVE
Protein, UA: NEGATIVE
Spec Grav, UA: 1.025 (ref 1.010–1.025)
Urobilinogen, UA: 0.2 E.U./dL
pH, UA: 6 (ref 5.0–8.0)

## 2017-03-06 MED ORDER — TOPIRAMATE ER 25 MG PO CAP24: 25.0000 mg | ORAL_CAPSULE | Freq: Every day | ORAL | 1 refills | Status: DC

## 2017-03-06 MED ORDER — BUPROPION HCL ER (XL) 150 MG PO TB24: 150.0000 mg | ORAL_TABLET | Freq: Every day | ORAL | 3 refills | Status: DC

## 2017-03-06 NOTE — Telephone Encounter (Signed)
Patient called to inquire about buPROPion (WELLBUTRIN XL) 150 MG 24 hr tablet and if she is to take 1/2 a tablet or a whole; I clarified with clinical staff and it is to be taken 1/2.   Also, patient called [facility] to discuss a change in her MRI appointment due to a scheduling conflict.

## 2017-03-06 NOTE — Telephone Encounter (Signed)
Noted  

## 2017-03-06 NOTE — Patient Instructions (Signed)
Call within a week if symptoms are not improving.

## 2017-03-08 DIAGNOSIS — R3915 Urgency of urination: Secondary | ICD-10-CM | POA: Insufficient documentation

## 2017-03-08 DIAGNOSIS — F331 Major depressive disorder, recurrent, moderate: Secondary | ICD-10-CM | POA: Insufficient documentation

## 2017-03-08 DIAGNOSIS — R51 Headache: Secondary | ICD-10-CM

## 2017-03-08 DIAGNOSIS — G8929 Other chronic pain: Secondary | ICD-10-CM | POA: Insufficient documentation

## 2017-03-08 DIAGNOSIS — R519 Headache, unspecified: Secondary | ICD-10-CM | POA: Insufficient documentation

## 2017-03-08 NOTE — Progress Notes (Signed)
Laura Maldonado is a 51 y.o. female is here for follow up.  History of Present Illness:   HPI: Please see assessment and plan for problem based charting.  Health Maintenance Due  Topic Date Due  . HIV Screening  03/22/1980  . COLONOSCOPY  03/23/2015   PMHx, SurgHx, SocialHx, FamHx, Medications, and Allergies were reviewed in the Visit Navigator and updated as appropriate.   Patient Active Problem List   Diagnosis Date Noted  . Chronic nonintractable headache 03/08/2017  . Moderate episode of recurrent major depressive disorder ([place]) 03/08/2017  . Urgency of urination 03/08/2017  . Obesity (BMI 30-39.9) 01/15/2017  . Vitamin D deficiency 05/29/2016  . Pure hypercholesterolemia 05/29/2016  . Multiple sclerosis ([place]) 05/29/2016   Social History  Substance Use Topics  . Smoking status: Never Smoker  . Smokeless tobacco: Never Used  . Alcohol use No   Current Medications and Allergies:   .  B Complex Vitamins (VITAMIN-B COMPLEX) TABS, Take by mouth., Disp: , Rfl:  .  buPROPion (WELLBUTRIN XL) 150 MG 24 hr tablet, Take 1 tablet (150 mg total) by mouth daily., Disp: 30 tablet, Rfl: 3 .  Cholecalciferol (VITAMIN D) 2000 units tablet, Take by mouth., Disp: , Rfl:  .  Cholecalciferol (VITAMIN D) 2000 units tablet, Take by mouth., Disp: , Rfl:  .  ciclopirox (PENLAC) 8 % solution, Apply topically., Disp: , Rfl:  .  clotrimazole-betamethasone (LOTRISONE) cream, Apply 1 application topically 2 (two) times daily., Disp: 30 g, Rfl: 0 .  Flaxseed, Linseed, (FLAXSEED OIL PO), Take by mouth., Disp: , Rfl:  .  ibuprofen (ADVIL,MOTRIN) 200 MG tablet, Take by mouth., Disp: , Rfl:  .  LORazepam (ATIVAN) 0.5 MG tablet, Take 1 tablet (0.5 mg total) by mouth every 8 (eight) hours., Disp: 30 tablet, Rfl: 0 .  Magnesium 500 MG TABS, Take by mouth., Disp: , Rfl:  .  Probiotic Product (PROBIOTIC MATURE ADULT) CAPS, Take by mouth., Disp: , Rfl:  .  topiramate (TOPAMAX) 50 MG tablet, Take by mouth.,  Disp: , Rfl:  .  Turmeric 500 MG CAPS, Take by mouth., Disp: , Rfl:   Allergies  Allergen Reactions  . Shellfish Allergy Shortness Of Breath  . Interferons Other (See Comments)    Chest pains, burning trouble breathing itching  . Codeine Nausea Only  . Flagyl [Metronidazole]   . Gabapentin Hives  . Gluten Meal Diarrhea and Other (See Comments)    Stomach pain   . Lactose     Other reaction(s): Unknown  . Lamotrigine Hives   Review of Systems   Pertinent items are noted in the HPI. Otherwise, ROS is negative.  Vitals:   Vitals:   03/06/17 0742  BP: 110/72  Pulse: 71  Temp: 98.2 F (36.8 C)  TempSrc: Oral  SpO2: 98%  Weight: 221 lb (100.2 kg)     Body mass index is 36.22 kg/m.   Physical Exam:   Physical Exam  Constitutional: She is oriented to person, place, and time. She appears well-developed and well-nourished. No distress.  HENT:  Head: Normocephalic and atraumatic.  Right Ear: External ear normal.  Left Ear: External ear normal.  Nose: Nose normal.  Mouth/Throat: Oropharynx is clear and moist.  Eyes: Pupils are equal, round, and reactive to light. Conjunctivae and EOM are normal.  Neck: Normal range of motion. Neck supple. No thyromegaly present.  Cardiovascular: Normal rate, regular rhythm, normal heart sounds and intact distal pulses.   Pulmonary/Chest: Effort normal.  Abdominal: Soft. Bowel  sounds are normal. She exhibits no distension. There is no tenderness. There is no guarding.  Neurological: She is alert and oriented to person, place, and time. No cranial nerve deficit.  Skin: Skin is warm. Capillary refill takes less than 2 seconds.  Psychiatric: She has a normal mood and affect. Her behavior is normal.  Nursing note and vitals reviewed.   Results for orders placed or performed in visit on 03/06/17  POCT urinalysis dipstick  Result Value Ref Range   Color, UA Yellow    Clarity, UA Clear    Glucose, UA Negative    Bilirubin, UA Negative     Ketones, UA Negative    Spec Grav, UA 1.025 1.010 - 1.025   Blood, UA Negative    pH, UA 6.0 5.0 - 8.0   Protein, UA Negative    Urobilinogen, UA 0.2 0.2 or 1.0 E.U./dL   Nitrite, UA Negative    Leukocytes, UA Negative Negative   Assessment and Plan:   Problem  Chronic Nonintractable Headache   Chronic issue. Known MS with plaques in the brain. No new neurologic symptoms today. We discussed her prescription of Topamax. The patient admits that she has not been taking it regularly because of word finding difficulty. I let her know that there is a brand name long-acting form called Trokendi that may limit side effects. She would like to try it. Prescription given with coupon today.   Moderate Episode of Recurrent Major Depressive Disorder ([facility])   Significantly worsened over the past few months. Patient feels that the summer heat makes her MS worse. She does try to take walks in the morning, but admits that she stays in her home for most days otherwise. She is already followed by therapist but will increase the frequency of visits. We discussed the benefits and risks of adding a medication. We ultimately decided on Wellbutrin.   Urgency of Urination   Urinary dip was without concern today. Upon further questioning, the patient has a history of similar issues that have been evaluated in the past. She is unclear of this history, but it sounds like she may have interstitial cystitis. I let her know that if she continues to have frequency and discomfort with urination, that I would be happy to call in [place] or have her reevaluated by Urology.   Obesity (Bmi 30-39.9)   We continue to work on making sure that added medications do not hinder weight loss. Nutrition and exercise counseling provided. Added medications will hopefully have added benefit of weight loss.   Pure Hypercholesterolemia   Lab Results  Component Value Date   CHOL 210 (H) 01/16/2017   HDL 73.20 01/16/2017   LDLCALC 122 (H)  01/16/2017   TRIG 73.0 01/16/2017   CHOLHDL 3 01/16/2017   High HDL. The patient declines a statin at this time.   Multiple Sclerosis (Hcc)   Follows with Neurologists at [facility] every 6 months. Her next MRI is in October.     . Reviewed expectations re: course of current medical issues. . Discussed self-management of symptoms. . Outlined signs and symptoms indicating need for more acute intervention. . Patient verbalized understanding and all questions were answered. Marland Kitchen Health Maintenance issues including appropriate healthy diet, exercise, and smoking avoidance were discussed with patient. . See orders for this visit as documented in the electronic medical record. . Patient received an After Visit Summary.  CMA served as Education administrator during this visit. History, Physical, and Plan performed by medical provider. The above  documentation has been reviewed and is accurate and complete. Briscoe Deutscher, D.O.  Briscoe Deutscher, DO [place] 03/08/2017  Future Appointments Date Time Provider [place]  04/08/2017 9:00 AM [facility] PREVISIT RM[facility]  04/22/2017 11:00 AM Gatha Mayer, MD [facility]

## 2017-04-03 DIAGNOSIS — G35 Multiple sclerosis: Secondary | ICD-10-CM | POA: Diagnosis not present

## 2017-04-03 DIAGNOSIS — H01004 Unspecified blepharitis left upper eyelid: Secondary | ICD-10-CM | POA: Diagnosis not present

## 2017-04-03 DIAGNOSIS — H01001 Unspecified blepharitis right upper eyelid: Secondary | ICD-10-CM | POA: Diagnosis not present

## 2017-04-13 ENCOUNTER — Ambulatory Visit: Payer: 59 | Admitting: Family Medicine

## 2017-04-13 ENCOUNTER — Telehealth: Payer: Self-pay | Admitting: Surgical

## 2017-04-13 ENCOUNTER — Encounter: Payer: Self-pay | Admitting: Family Medicine

## 2017-04-13 ENCOUNTER — Ambulatory Visit (INDEPENDENT_AMBULATORY_CARE_PROVIDER_SITE_OTHER): Payer: 59 | Admitting: Family Medicine

## 2017-04-13 VITALS — BP 122/80 | HR 78 | Temp 97.5°F | Wt 218.4 lb

## 2017-04-13 DIAGNOSIS — M5030 Other cervical disc degeneration, unspecified cervical region: Secondary | ICD-10-CM

## 2017-04-13 DIAGNOSIS — M25511 Pain in right shoulder: Secondary | ICD-10-CM | POA: Diagnosis not present

## 2017-04-13 DIAGNOSIS — M5020 Other cervical disc displacement, unspecified cervical region: Secondary | ICD-10-CM | POA: Diagnosis not present

## 2017-04-13 MED ORDER — PREDNISONE 10 MG PO TABS: ORAL_TABLET | ORAL | 0 refills | Status: DC

## 2017-04-13 MED ORDER — OXYCODONE-ACETAMINOPHEN 2.5-325 MG PO TABS: 1.0000 | ORAL_TABLET | Freq: Two times a day (BID) | ORAL | 0 refills | Status: DC | PRN

## 2017-04-13 MED ORDER — OXYCODONE-ACETAMINOPHEN 5-325 MG PO TABS: 0.5000 | ORAL_TABLET | Freq: Two times a day (BID) | ORAL | 0 refills | Status: DC | PRN

## 2017-04-13 MED ORDER — CYCLOBENZAPRINE HCL 5 MG PO TABS: 2.5000 mg | ORAL_TABLET | Freq: Two times a day (BID) | ORAL | 1 refills | Status: DC | PRN

## 2017-04-13 MED ORDER — ONDANSETRON HCL 4 MG PO TABS: 4.0000 mg | ORAL_TABLET | Freq: Three times a day (TID) | ORAL | 0 refills | Status: DC | PRN

## 2017-04-13 NOTE — Telephone Encounter (Signed)
Patient walked in today with right arm pain. Patient stated that she had the same problem in 2016. She stated that her right arm started to hurt several weeks ago and escalated on Saturday. Today she said that the pain is at a 10 with tingling in the hand. She said that in 2016 they found that C5 and C6 was bulging disc. Patient denies any injury to cause this. Only thing that she recalls is lifting a case of water on Friday night. I spoke with Dr. [name] about this and she agreed patient does need to be seen. I placed patient on [facility] schedule. Jackelyn Poling is aware of this. Patient vitals was stable when I triaged.

## 2017-04-13 NOTE — Patient Instructions (Signed)
Cervical Radiculopathy Cervical radiculopathy happens when a nerve in the neck (cervical nerve) is pinched or bruised. This condition can develop because of an injury or as part of the normal aging process. Pressure on the cervical nerves can cause pain or numbness that runs from the neck all the way down into the arm and fingers. Usually, this condition gets better with rest. Treatment may be needed if the condition does not improve. What are the causes? This condition may be caused by:  Injury.  Slipped (herniated) disk.  Muscle tightness in the neck because of overuse.  Arthritis.  Breakdown or degeneration in the bones and joints of the spine (spondylosis) due to aging.  Bone spurs that may develop near the cervical nerves.  What are the signs or symptoms? Symptoms of this condition include:  Pain that runs from the neck to the arm and hand. The pain can be severe or irritating. It may be worse when the neck is moved.  Numbness or weakness in the affected arm and hand.  How is this diagnosed? This condition may be diagnosed based on symptoms, medical history, and a physical exam. You may also have tests, including:  X-rays.  CT scan.  MRI.  Electromyogram (EMG).  Nerve conduction tests.  How is this treated? In many cases, treatment is not needed for this condition. With rest, the condition usually gets better over time. If treatment is needed, options may include:  Wearing a soft neck collar for short periods of time.  Physical therapy to strengthen your neck muscles.  Medicines, such as NSAIDs, oral corticosteroids, or spinal injections.  Surgery. This may be needed if other treatments do not help. Various types of surgery may be done depending on the cause of your problems.  Follow these instructions at home: Managing pain  Take over-the-counter and prescription medicines only as told by your health care provider.  If directed, apply ice to the affected  area. ? Put ice in a plastic bag. ? Place a towel between your skin and the bag. ? Leave the ice on for 20 minutes, 2-3 times per day.  If ice does not help, you can try using heat. Take a warm shower or warm bath, or use a heat pack as told by your health care provider.  Try a gentle neck and shoulder massage to help relieve symptoms. Activity  Rest as needed. Follow instructions from your health care provider about any restrictions on activities.  Do stretching and strengthening exercises as told by your health care provider or physical therapist. General instructions  If you were given a soft collar, wear it as told by your health care provider.  Use a flat pillow when you sleep.  Keep all follow-up visits as told by your health care provider. This is important. Contact a health care provider if:  Your condition does not improve with treatment. Get help right away if:  Your pain gets much worse and cannot be controlled with medicines.  You have weakness or numbness in your hand, arm, face, or leg.  You have a high fever.  You have a stiff, rigid neck.  You lose control of your bowels or your bladder (have incontinence).  You have trouble with walking, balance, or speaking. This information is not intended to replace advice given to you by your health care provider. Make sure you discuss any questions you have with your health care provider. Document Released: 04/29/2001 Document Revised: 01/10/2016 Document Reviewed: 09/28/2014 Elsevier Interactive Patient Education    2018 [employer]  

## 2017-04-13 NOTE — Progress Notes (Signed)
Subjective:    Patient ID: Laura Maldonado, female    DOB: 1965/11/09, 51 y.o.   MRN: [id number]  HPI This is a 51 yo female, accompanied by her husband, who presents today with right sided neck and arm pain. Pain is constant, some tightening, sharp, difficulty sleeping last night.  Pain occurred 3 days ago, onset sudden. Little relief with ibuprofen 600 mg po q 4 hours, ice or heat. Has had similar problems in past and felt like "something has been brewing," with some twinges of pain over last several weeks. She lifted a case of water from a shopping cart the day prior to pain starting. She had similar symptoms in 2016 and went to ER and had Xray. Was diagnosed with bulging disk. She was treated with prednisone, pain medication. MRI from 12/15 showed C4-C5 disc bulging, C7-T1- disc bulge, T1-T2 disc bulge. She has had PT in the past but stopped exercises because she was asymptomatic.  She denies headache, dizziness, falls, loss of bowel or bladder function. Last BM yesterday, normal.    Past Medical History:  Diagnosis Date  . Anxiety   . Chicken pox   . Diverticulitis   . Frequent headaches   . Migraines   . Multiple sclerosis ([place]) 05/29/2016   [facility] q 6 months.  . Obesity (BMI 30-39.9) 01/15/2017  . Pure hypercholesterolemia 05/29/2016  . Vitamin D deficiency 05/29/2016   Past Surgical History:  Procedure Laterality Date  . ABDOMINAL HYSTERECTOMY    . BREAST LUMPECTOMY Left 2007  . CHOLECYSTECTOMY    . MELANOMA EXCISION  1986  . MOLE REMOVAL     Family History  Problem Relation Age of Onset  . Hypertension Mother   . Heart disease Father   . Hypertension Maternal Grandmother   . Hypertension Maternal Grandfather   . Hypertension Paternal Grandmother   . Hypertension Paternal Grandfather    Social History  Substance Use Topics  . Smoking status: Never Smoker  . Smokeless tobacco: Never Used  . Alcohol use No      Review of Systems Per HPI    Objective:   Physical Exam  Constitutional: She is oriented to person, place, and time. She appears well-developed and well-nourished. No distress.  HENT:  Head: Normocephalic and atraumatic.  Eyes: Pupils are equal, round, and reactive to light. Conjunctivae and EOM are normal. Right eye exhibits no discharge. Left eye exhibits no discharge.  Neck: Muscular tenderness present. No spinous process tenderness present. No neck rigidity. Decreased range of motion present. No edema and no erythema present. No thyromegaly present.  Generally decreased ROM of neck.   Cardiovascular: Normal rate.   Pulmonary/Chest: Effort normal.  Musculoskeletal:       Right shoulder: She exhibits decreased range of motion and tenderness. She exhibits no bony tenderness.  Unable to raise right arm. NML ROM fingers, wrist, elbow. Strong radial pulses, normal temperature and color.   Lymphadenopathy:    She has no cervical adenopathy.  Neurological: She is alert and oriented to person, place, and time. She has normal reflexes.  Skin: Skin is warm and dry. She is not diaphoretic.  Psychiatric: She has a normal mood and affect. Her behavior is normal. Judgment and thought content normal.  Vitals reviewed.   BP 122/80   Pulse 78   SpO2 97%      Assessment & Plan:  1. Acute pain of right shoulder - predniSONE (DELTASONE) 10 MG tablet; Take 6 x 1 day, then 5, 4,  3, 2, 1  Dispense: 21 tablet; Refill: 0 - cyclobenzaprine (FLEXERIL) 5 MG tablet; Take 0.5-1 tablets (2.5-5 mg total) by mouth 2 (two) times daily as needed for muscle spasms.  Dispense: 12 tablet; Refill: 1 - ondansetron (ZOFRAN) 4 MG tablet; Take 1 tablet (4 mg total) by mouth every 8 (eight) hours as needed for nausea or vomiting.  Dispense: 20 tablet; Refill: 0 - oxyCODONE-acetaminophen (PERCOCET/ROXICET) 5-325 MG tablet; Take 0.5-1 tablets by mouth every 12 (twelve) hours as needed for severe pain.  Dispense: 10 tablet; Refill: 0  2. Bulging of cervical  intervertebral disc - she has known cervical abnormality. She is scheduled for MRI of her head at [facility], I will contact her neurology PA and see if MRI of her neck can be added. If pain not resolved, may need to image sooner - RTC/ED precautions reviewed - encouraged gentle ROM, avoiding sitting/staning in one position more than 30 minutes, when pain resolved, encouraged her to resume exercises from PT - predniSONE (DELTASONE) 10 MG tablet; Take 6 x 1 day, then 5, 4, 3, 2, 1  Dispense: 21 tablet; Refill: 0 - cyclobenzaprine (FLEXERIL) 5 MG tablet; Take 0.5-1 tablets (2.5-5 mg total) by mouth 2 (two) times daily as needed for muscle spasms.  Dispense: 12 tablet; Refill: 1 - ondansetron (ZOFRAN) 4 MG tablet; Take 1 tablet (4 mg total) by mouth every 8 (eight) hours as needed for nausea or vomiting.  Dispense: 20 tablet; Refill: 0 - oxyCODONE-acetaminophen (PERCOCET/ROXICET) 5-325 MG tablet; Take 0.5-1 tablets by mouth every 12 (twelve) hours as needed for severe pain.  Dispense: 10 tablet; Refill: 0   Clarene Reamer, FNP-BC   [facility] [place]  04/13/2017 9:51 AM

## 2017-04-22 ENCOUNTER — Encounter: Payer: 59 | Admitting: Internal Medicine

## 2017-04-27 ENCOUNTER — Other Ambulatory Visit: Payer: Self-pay | Admitting: Family Medicine

## 2017-04-27 MED ORDER — TOPIRAMATE ER 25 MG PO CAP24
25.0000 mg | ORAL_CAPSULE | Freq: Every day | ORAL | 1 refills | Status: DC
Start: 2017-04-27 — End: 2017-05-22

## 2017-04-27 NOTE — Telephone Encounter (Signed)
Medication sent in for patient. 

## 2017-04-27 NOTE — Telephone Encounter (Signed)
MEDICATION:   Topiramate ER (TROKENDI XR) 25 MG CP24     PHARMACY:  [id number] [place], [place] - [address] [phone] (Phone) [phone] (Fax)       IS THIS A 90 DAY SUPPLY : yes  IS PATIENT OUT OF MEDICATION: yes  IF NOT; HOW MUCH IS LEFT: n/a  LAST APPOINTMENT DATE: 04/13/17  NEXT APPOINTMENT DATE:05/20/17  OTHER COMMENTS: Call patient to notify if she has to wait on Dr. [name] approval on Wednesday or if the medication is able to be processed.    **Let patient know to contact pharmacy at the end of the day to make sure medication is ready. **  ** Please notify patient to allow 48-72 hours to process**  **Encourage patient to contact the pharmacy for refills or they can request refills through [facility]**

## 2017-04-29 ENCOUNTER — Encounter: Payer: Self-pay | Admitting: Family Medicine

## 2017-05-01 ENCOUNTER — Other Ambulatory Visit: Payer: Self-pay

## 2017-05-01 MED ORDER — BUPROPION HCL ER (XL) 150 MG PO TB24: 150.0000 mg | ORAL_TABLET | Freq: Every day | ORAL | 0 refills | Status: DC

## 2017-05-18 ENCOUNTER — Ambulatory Visit: Payer: 59 | Admitting: Family Medicine

## 2017-05-20 ENCOUNTER — Ambulatory Visit: Payer: 59 | Admitting: Family Medicine

## 2017-05-22 ENCOUNTER — Other Ambulatory Visit: Payer: Self-pay

## 2017-05-22 MED ORDER — TOPIRAMATE ER 25 MG PO CAP24: 1.0000 | ORAL_CAPSULE | Freq: Every day | ORAL | 0 refills | Status: DC

## 2017-05-26 ENCOUNTER — Ambulatory Visit (AMBULATORY_SURGERY_CENTER): Payer: Self-pay | Admitting: *Deleted

## 2017-05-26 VITALS — Ht 65.5 in | Wt 222.4 lb

## 2017-05-26 DIAGNOSIS — Z1211 Encounter for screening for malignant neoplasm of colon: Secondary | ICD-10-CM

## 2017-05-26 NOTE — Progress Notes (Signed)
No egg or soy allergy known to patient   issues with past sedation with any surgeries  or procedures of PONV only , no intubation problems  No diet pills per patient- off phentermine per pt- no plans to continue this med  No home 02 use per patient  No blood thinners per patient  Pt denies issues with constipation  No A fib or A flutter  EMMI video sent to pt's e mail - pt declined

## 2017-05-28 ENCOUNTER — Ambulatory Visit (INDEPENDENT_AMBULATORY_CARE_PROVIDER_SITE_OTHER): Payer: 59 | Admitting: Family Medicine

## 2017-05-28 ENCOUNTER — Telehealth: Payer: Self-pay

## 2017-05-28 ENCOUNTER — Telehealth: Payer: Self-pay | Admitting: Family Medicine

## 2017-05-28 ENCOUNTER — Encounter: Payer: Self-pay | Admitting: Family Medicine

## 2017-05-28 VITALS — BP 124/78 | HR 85 | Temp 98.1°F | Ht 65.5 in | Wt 220.6 lb

## 2017-05-28 DIAGNOSIS — R51 Headache: Secondary | ICD-10-CM | POA: Diagnosis not present

## 2017-05-28 DIAGNOSIS — F4321 Adjustment disorder with depressed mood: Secondary | ICD-10-CM | POA: Diagnosis not present

## 2017-05-28 DIAGNOSIS — R519 Headache, unspecified: Secondary | ICD-10-CM

## 2017-05-28 DIAGNOSIS — G8929 Other chronic pain: Secondary | ICD-10-CM

## 2017-05-28 DIAGNOSIS — E669 Obesity, unspecified: Secondary | ICD-10-CM | POA: Diagnosis not present

## 2017-05-28 DIAGNOSIS — G35 Multiple sclerosis: Secondary | ICD-10-CM | POA: Diagnosis not present

## 2017-05-28 MED ORDER — MODAFINIL 200 MG PO TABS: 200.0000 mg | ORAL_TABLET | Freq: Every day | ORAL | 0 refills | Status: DC

## 2017-05-28 NOTE — Telephone Encounter (Signed)
PA for modafinil started through [employer].  Waiting for insurance decision.

## 2017-05-28 NOTE — Patient Instructions (Signed)
Please start listening to your body.  Keep a diary - especially of your headaches and food intake.

## 2017-05-28 NOTE — Telephone Encounter (Signed)
[facility] modafinil (PROVIGIL) 200 MG tablet. Please call pharmacy and advise.

## 2017-05-28 NOTE — Progress Notes (Signed)
Laura Maldonado is a 51 y.o. female is here for follow up.  History of Present Illness:   Water quality scientist, CMA, acting as scribe for Dr. [name].  HPI:   1. Multiple sclerosis (Laura Maldonado). Upcoming MRI. Experiencing fatigue. Still unable to walk much due to heat.     2. Situational depression. Stable. Related to town home. PHQ9 below.    3. Chronic nonintractable headache. Migraines stable. More tension headaches. Using mouthgaurd. Clenching jaw at night.    4. Obesity (BMI 30-39.9). Calorie intake too low - around 600-800/day. No exercise.     Health Maintenance Due  Topic Date Due  . HIV Screening  03/22/1980  . COLONOSCOPY  03/23/2015   Depression screen [facility] 2/9 05/28/2017 05/28/2017  Decreased Interest 1 1  PHQ - 2 Score 1 1  Altered sleeping 0 -  Tired, decreased energy 2 -  Change in appetite 0 -  Feeling bad or failure about yourself  0 -  Trouble concentrating 1 -  Moving slowly or fidgety/restless 0 -  Suicidal thoughts 0 -  PHQ-9 Score 4 -   PMHx, SurgHx, SocialHx, FamHx, Medications, and Allergies were reviewed in the Visit Navigator and updated as appropriate.   Patient Active Problem List   Diagnosis Date Noted  . Chronic nonintractable headache 03/08/2017  . Moderate episode of recurrent major depressive disorder (Laura Maldonado) 03/08/2017  . Obesity (BMI 30-39.9) 01/15/2017  . Vitamin D deficiency 05/29/2016  . Pure hypercholesterolemia 05/29/2016  . Multiple sclerosis (Laura Maldonado) 05/29/2016   Social History  Substance Use Topics  . Smoking status: Never Smoker  . Smokeless tobacco: Never Used  . Alcohol use Yes     Comment: rare- maybe 5 x a year    Current Medications and Allergies:   .  B Complex Vitamins (B COMPLEX 100 PO), Take 1 capsule by mouth daily., Disp: , Rfl:  .  BIOTIN PO, Take 1 capsule by mouth daily., Disp: , Rfl:  .  bisacodyl (DULCOLAX) 5 MG EC tablet, Take 5 mg by mouth once. For colon 10-30 x 4, Disp: , Rfl:  .  Calcium Carb-Cholecalciferol  (CALCIUM 1000 + D PO), Take 1 tablet by mouth daily., Disp: , Rfl:  .  ciclopirox (PENLAC) 8 % solution, Apply topically., Disp: , Rfl:  .  CRANBERRY PO, Take by mouth daily. 1-2 a day, Disp: , Rfl:  .  cyclobenzaprine (FLEXERIL) 5 MG tablet, Take 0.5-1 tablets (2.5-5 mg total) by mouth 2 (two) times daily as needed for muscle spasms. .  Flaxseed, Linseed, (FLAXSEED OIL PO), Take by mouth., Disp: , Rfl:  .  ibuprofen (ADVIL,MOTRIN) 200 MG tablet, Take by mouth., Disp: , Rfl:  .  LORazepam (ATIVAN) 0.5 MG tablet, Take 1 tablet (0.5 mg total) by mouth every 8 (eight) hours., Disp: 30 tablet, Rfl: 0 .  Magnesium 500 MG TABS, Take by mouth., Disp: , Rfl:  .  polyethylene glycol powder (MIRALAX) powder, Take 1 Container by mouth once. 238 grams for colon 10-30, Disp: , Rfl:  .  Probiotic Product (PROBIOTIC MATURE ADULT) CAPS, Take by mouth., Disp: , Rfl:  .  Topiramate ER (TROKENDI XR) 25 MG CP24, Take 1 capsule by mouth at bedtime., Disp: 90 capsule, Rfl: 0 .  Turmeric 500 MG CAPS, Take by mouth., Disp: , Rfl:   Allergies  Allergen Reactions  . Shellfish Allergy Shortness Of Breath  . Interferons Other (See Comments)    Chest pains, burning trouble breathing itching  . Codeine Nausea Only  .  Flagyl [Metronidazole] Other (See Comments)    Migraines   . Gabapentin Hives  . Gluten Meal Diarrhea and Other (See Comments)    Stomach pain   . Lactose     Other reaction(s): Unknown  . Lamotrigine Hives   Review of Systems   Pertinent items are noted in the HPI. Otherwise, ROS is negative.  Vitals:   Vitals:   05/28/17 0732  BP: 124/78  Pulse: 85  Temp: 98.1 F (36.7 C)  TempSrc: Oral  SpO2: 97%  Weight: 220 lb 9.6 oz (100.1 kg)  Height: 5' 5.5" (1.664 m)     Body mass index is 36.15 kg/m.   Physical Exam:   Physical Exam  Constitutional: She appears well-nourished.  HENT:  Head: Normocephalic and atraumatic.  Eyes: Pupils are equal, round, and reactive to light. EOM are  normal.  Neck: Normal range of motion. Neck supple.  Cardiovascular: Normal rate, regular rhythm, normal heart sounds and intact distal pulses.   Pulmonary/Chest: Effort normal.  Abdominal: Soft.  Skin: Skin is warm.  Psychiatric: She has a normal mood and affect. Her behavior is normal.  Nursing note and vitals reviewed.   Assessment and Plan:   [name] was seen today for follow-up.  Diagnoses and all orders for this visit:  Multiple sclerosis (Laura Maldonado) Comments: With fatigue. Okay trial of Provigil. Orders: -     modafinil (PROVIGIL) 200 MG tablet; Take 1 tablet (200 mg total) by mouth daily.  Situational depression Comments: She has been dealing with homwowner's issues for about a year now. Not tolerating Wellbutrin. Will stop.   Chronic nonintractable headache, unspecified headache type Comments: Topamax helping with migraines, but she is now also experiencing tension headaches. Okay to take Flexeril at night.   Obesity (BMI 30-39.9) Comments: Encouraged her to keep a journal and to "listen to her body." Increase calories to 1400/day. Protein with each meal. Hydrate. Walk now that the weather is improving.    . Reviewed expectations re: course of current medical issues. . Discussed self-management of symptoms. . Outlined signs and symptoms indicating need for more acute intervention. . Patient verbalized understanding and all questions were answered. Marland Kitchen Health Maintenance issues including appropriate healthy diet, exercise, and smoking avoidance were discussed with patient. . See orders for this visit as documented in the electronic medical record. . Patient received an After Visit Summary.  CMA served as Education administrator during this visit. History, Physical, and Plan performed by medical provider. The above documentation has been reviewed and is accurate and complete. Briscoe Deutscher, D.O.  Briscoe Deutscher, DO [place] 05/28/2017  Future Appointments Date Time  Provider [place]  06/16/2017 11:00 AM Gatha Mayer, MD [facility]  11/25/2017 7:30 AM Briscoe Deutscher, DO [facility] None

## 2017-05-28 NOTE — Telephone Encounter (Signed)
In progress

## 2017-06-02 ENCOUNTER — Encounter: Payer: Self-pay | Admitting: Internal Medicine

## 2017-06-03 ENCOUNTER — Telehealth: Payer: Self-pay | Admitting: *Deleted

## 2017-06-03 NOTE — Telephone Encounter (Signed)
Patient called stating she received a letter that Laura Maldonado was denied. Patient wants to know what else Dr [name] would recommend. Please advise [phone]   Patient stated she would like to speak with the nurse regarding the letter.

## 2017-06-04 NOTE — Telephone Encounter (Signed)
Patient states she tried amantadine in the past and it was ineffective.  I have resubmitted prior authorization with this information.  Waiting for insurance decision.

## 2017-06-04 NOTE — Telephone Encounter (Signed)
Patient's modafinil was denied.  Per insurance, patient has to try amantadine first.  Per Dr. [name], patient should speak to her neurologist about trying this medication.

## 2017-06-04 NOTE — Telephone Encounter (Signed)
LM for patient to return call.  Also sent MyChart message with detailed information.  Let patient know I was sending MyChart message when I left the voicemail.

## 2017-06-05 ENCOUNTER — Telehealth: Payer: Self-pay | Admitting: Internal Medicine

## 2017-06-05 NOTE — Telephone Encounter (Signed)
PA for Modafinil approved through 06/04/2018.  Patient and pharmacy notified.

## 2017-06-05 NOTE — Telephone Encounter (Signed)
Talked with patient.  Told her it would be okay to take Provigil.  Reviewed other meds with patient and questions answered.

## 2017-06-07 DIAGNOSIS — G35 Multiple sclerosis: Secondary | ICD-10-CM | POA: Diagnosis not present

## 2017-06-08 DIAGNOSIS — G35 Multiple sclerosis: Secondary | ICD-10-CM | POA: Diagnosis not present

## 2017-06-08 DIAGNOSIS — G43709 Chronic migraine without aura, not intractable, without status migrainosus: Secondary | ICD-10-CM | POA: Diagnosis not present

## 2017-06-08 DIAGNOSIS — M25511 Pain in right shoulder: Secondary | ICD-10-CM | POA: Diagnosis not present

## 2017-06-16 ENCOUNTER — Encounter: Payer: 59 | Admitting: Internal Medicine

## 2017-07-13 ENCOUNTER — Encounter: Payer: 59 | Admitting: Internal Medicine

## 2017-07-16 DIAGNOSIS — B351 Tinea unguium: Secondary | ICD-10-CM | POA: Diagnosis not present

## 2017-07-16 DIAGNOSIS — D692 Other nonthrombocytopenic purpura: Secondary | ICD-10-CM | POA: Diagnosis not present

## 2017-07-23 ENCOUNTER — Telehealth: Payer: Self-pay | Admitting: Family Medicine

## 2017-07-23 ENCOUNTER — Encounter: Payer: Self-pay | Admitting: Internal Medicine

## 2017-07-23 ENCOUNTER — Ambulatory Visit (AMBULATORY_SURGERY_CENTER): Payer: 59 | Admitting: Internal Medicine

## 2017-07-23 ENCOUNTER — Other Ambulatory Visit: Payer: Self-pay

## 2017-07-23 VITALS — BP 91/67 | HR 66 | Temp 98.0°F | Resp 15 | Ht 65.5 in | Wt 222.0 lb

## 2017-07-23 DIAGNOSIS — Z1211 Encounter for screening for malignant neoplasm of colon: Secondary | ICD-10-CM | POA: Diagnosis present

## 2017-07-23 DIAGNOSIS — Z1212 Encounter for screening for malignant neoplasm of rectum: Secondary | ICD-10-CM

## 2017-07-23 DIAGNOSIS — G35 Multiple sclerosis: Secondary | ICD-10-CM

## 2017-07-23 MED ORDER — SODIUM CHLORIDE 0.9 % IV SOLN: 500.0000 mL | Freq: Once | INTRAVENOUS | Status: DC

## 2017-07-23 NOTE — Op Note (Signed)
[facility] Patient Name: Laura Maldonado Procedure Date: 07/23/2017 3:36 PM MRN: [id number] Endoscopist: Gatha Mayer , MD Age: 51 Referring MD:  Date of Birth: [birth year] Gender: Female Account #: [id number] Procedure:                Colonoscopy Indications:              Screening for colorectal malignant neoplasm, Last                            colonoscopy: 2008 Medicines:                Propofol per Anesthesia, Monitored Anesthesia Care Procedure:                Pre-Anesthesia Assessment:                           - Prior to the procedure, a History and Physical                            was performed, and patient medications and                            allergies were reviewed. The patient's tolerance of                            previous anesthesia was also reviewed. The risks                            and benefits of the procedure and the sedation                            options and risks were discussed with the patient.                            All questions were answered, and informed consent                            was obtained. Prior Anticoagulants: The patient has                            taken no previous anticoagulant or antiplatelet                            agents. ASA Grade Assessment: II - A patient with                            mild systemic disease. After reviewing the risks                            and benefits, the patient was deemed in                            satisfactory condition to undergo the procedure.  After obtaining informed consent, the colonoscope                            was passed under direct vision. Throughout the                            procedure, the patient's blood pressure, pulse, and                            oxygen saturations were monitored continuously. The                            Colonoscope was introduced through the anus and                            advanced to the  the cecum, identified by                            appendiceal orifice and ileocecal valve. The                            colonoscopy was performed without difficulty. The                            patient tolerated the procedure well. The quality                            of the bowel preparation was excellent. The                            ileocecal valve, appendiceal orifice, and rectum                            were photographed. The bowel preparation used was                            Miralax. Scope In: 3:47:27 PM Scope Out: 4:03:17 PM Scope Withdrawal Time: 0 hours 11 minutes 24 seconds  Total Procedure Duration: 0 hours 15 minutes 50 seconds  Findings:                 The perianal and digital rectal examinations were                            normal.                           Multiple small and large-mouthed diverticula were                            found in the sigmoid colon.                           The exam was otherwise without abnormality on  direct and retroflexion views. Complications:            No immediate complications. Estimated Blood Loss:     Estimated blood loss: none. Impression:               - Moderate diverticulosis in the sigmoid colon.                           - The examination was otherwise normal on direct                            and retroflexion views.                           - No specimens collected. Recommendation:           - Patient has a contact number available for                            emergencies. The signs and symptoms of potential                            delayed complications were discussed with the                            patient. Return to normal activities tomorrow.                            Written discharge instructions were provided to the                            patient.                           - Resume previous diet.                           - Continue present medications.                            - Repeat colonoscopy in 10 years for screening                            purposes. Gatha Mayer, MD 07/23/2017 4:16:18 PM This report has been signed electronically.

## 2017-07-23 NOTE — Telephone Encounter (Signed)
Copied from [place] [id number]. Topic: Quick Communication - See Telephone Encounter >> Jul 23, 2017 11:55 AM Boyd Kerbs wrote: CRM for notification. See Telephone encounter for:  Patient requesting refill Lorazepam 1/2mg   Please let pt. Know when ready to pick up Call leave a message  07/23/17.

## 2017-07-23 NOTE — Progress Notes (Signed)
To PACU, VSS. Report to RN.tb 

## 2017-07-23 NOTE — Patient Instructions (Addendum)
No polyps or cancer seen.  You do have diverticulosis - thickened muscle rings and pouches in the colon wall. Please read the handout about this condition.  I appreciate the opportunity to care for you. Gatha Mayer, MD, FACG  YOU HAD AN ENDOSCOPIC PROCEDURE TODAY AT [facility]:   Refer to the procedure report that was given to you for any specific questions about what was found during the examination.  If the procedure report does not answer your questions, please call your gastroenterologist to clarify.  If you requested that your care partner not be given the details of your procedure findings, then the procedure report has been included in a sealed envelope for you to review at your convenience later.  YOU SHOULD EXPECT: Some feelings of bloating in the abdomen. Passage of more gas than usual.  Walking can help get rid of the air that was put into your GI tract during the procedure and reduce the bloating. If you had a lower endoscopy (such as a colonoscopy or flexible sigmoidoscopy) you may notice spotting of blood in your stool or on the toilet paper. If you underwent a bowel prep for your procedure, you may not have a normal bowel movement for a few days.  Please Note:  You might notice some irritation and congestion in your nose or some drainage.  This is from the oxygen used during your procedure.  There is no need for concern and it should clear up in a day or so.  SYMPTOMS TO REPORT IMMEDIATELY:   Following lower endoscopy (colonoscopy or flexible sigmoidoscopy):  Excessive amounts of blood in the stool  Significant tenderness or worsening of abdominal pains  Swelling of the abdomen that is new, acute  Fever of 100F or higher   Following upper endoscopy (EGD)  Vomiting of blood or coffee ground material  New chest pain or pain under the shoulder blades  Painful or persistently difficult swallowing  New shortness of breath  Fever of 100F or  higher  Black, tarry-looking stools  For urgent or emergent issues, a gastroenterologist can be reached at any hour by calling [phone].   DIET:  We do recommend a small meal at first, but then you may proceed to your regular diet.  Drink plenty of fluids but you should avoid alcoholic beverages for 24 hours.  ACTIVITY:  You should plan to take it easy for the rest of today and you should NOT DRIVE or use heavy machinery until tomorrow (because of the sedation medicines used during the test).    FOLLOW UP: Our staff will call the number listed on your records the next business day following your procedure to check on you and address any questions or concerns that you may have regarding the information given to you following your procedure. If we do not reach you, we will leave a message.  However, if you are feeling well and you are not experiencing any problems, there is no need to return our call.  We will assume that you have returned to your regular daily activities without incident.  If any biopsies were taken you will be contacted by phone or by letter within the next 1-3 weeks.  Please call us at [phone] if you have not heard about the biopsies in 3 weeks.    SIGNATURES/CONFIDENTIALITY: You and/or your care partner have signed paperwork which will be entered into your electronic medical record.  These signatures attest to the fact that  that the information above on your After Visit Summary has been reviewed and is understood.  Full responsibility of the confidentiality of this discharge information lies with you and/or your care-partner.  Diverticulosis information given.

## 2017-07-24 ENCOUNTER — Telehealth: Payer: Self-pay | Admitting: *Deleted

## 2017-07-24 NOTE — Telephone Encounter (Signed)
Lorazepam refill.Last OV 05/28/17.

## 2017-07-24 NOTE — Telephone Encounter (Signed)
  Follow up Call-  Call back number 07/23/2017  Post procedure Call Back phone  # [phone]  Permission to leave phone message Yes     Patient questions:  Do you have a fever, pain , or abdominal swelling? No. Pain Score  0 *  Have you tolerated food without any problems? Yes.    Have you been able to return to your normal activities? Yes.    Do you have any questions about your discharge instructions: Diet   No. Medications  No. Follow up visit  No.  Do you have questions or concerns about your Care? Yes.    Actions: * If pain score is 4 or above: No action needed, pain <4.

## 2017-07-27 ENCOUNTER — Other Ambulatory Visit: Payer: Self-pay | Admitting: Family Medicine

## 2017-07-27 ENCOUNTER — Encounter: Payer: Self-pay | Admitting: Family Medicine

## 2017-07-27 DIAGNOSIS — G35 Multiple sclerosis: Secondary | ICD-10-CM

## 2017-07-27 NOTE — Telephone Encounter (Signed)
Copied from [place] [id number]. Topic: Quick Communication - Rx Refill/Question >> Jul 27, 2017  9:39 AM Scherrie Gerlach wrote: Pharmacy called for pt to request a refill of  buPROPion (WELLBUTRIN XL) 150 MG 24 hr tablet  90 day Preferred Pharmacy (with phone number or street name): [id number] [place], [place] [phone] (Phone) [phone] (Fax)  They say they sent 2 request already 12/06 and 12/07 (?)

## 2017-07-29 NOTE — Telephone Encounter (Signed)
Okay refill. 

## 2017-07-29 NOTE — Telephone Encounter (Signed)
Per OV 05/28/17 bupropion was discontinued. Last refill was 05/01/17 for 90 tablets with an end date 05/28/17.

## 2017-07-29 NOTE — Telephone Encounter (Signed)
Please advise on refill.

## 2017-07-29 NOTE — Telephone Encounter (Signed)
Does not need Wellbutrin. Please see other phone message.

## 2017-07-29 NOTE — Telephone Encounter (Signed)
Please advise 

## 2017-07-30 MED ORDER — LORAZEPAM 0.5 MG PO TABS: 0.5000 mg | ORAL_TABLET | Freq: Three times a day (TID) | ORAL | 0 refills | Status: DC

## 2017-07-30 NOTE — Telephone Encounter (Signed)
RX faxed to pharmacy.

## 2017-07-30 NOTE — Telephone Encounter (Signed)
Script faxed.

## 2017-07-30 NOTE — Telephone Encounter (Signed)
Okay refill. 

## 2017-08-28 ENCOUNTER — Other Ambulatory Visit: Payer: Self-pay | Admitting: Family Medicine

## 2017-08-31 NOTE — Telephone Encounter (Signed)
MEDICATION:   PHARMACY:  [facility]   IS THIS A 90 DAY SUPPLY : yes   IS PATIENT OUT OF MEDICATION:   IF NOT; HOW MUCH IS LEFT:   LAST APPOINTMENT DATE: @12 /05/2017  NEXT APPOINTMENT DATE:@4 /05/2018  OTHER COMMENTS:    **Let patient know to contact pharmacy at the end of the day to make sure medication is ready. **  ** Please notify patient to allow 48-72 hours to process**  **Encourage patient to contact the pharmacy for refills or they can request refills through [facility]**

## 2017-09-02 ENCOUNTER — Telehealth: Payer: 59 | Admitting: Nurse Practitioner

## 2017-09-02 DIAGNOSIS — J0100 Acute maxillary sinusitis, unspecified: Secondary | ICD-10-CM

## 2017-09-02 MED ORDER — AMOXICILLIN-POT CLAVULANATE 875-125 MG PO TABS: 1.0000 | ORAL_TABLET | Freq: Two times a day (BID) | ORAL | 0 refills | Status: DC

## 2017-09-02 NOTE — Progress Notes (Signed)

## 2017-09-04 ENCOUNTER — Ambulatory Visit: Payer: Self-pay

## 2017-09-04 NOTE — Telephone Encounter (Signed)
Pt. Called to discuss symptoms.  Reported she did an E-visit on 09/02/17.  Reported she continues to feel pressure in her head, nose, roof of mouth, and jaw line.  Reported she has had fever of 99.9 degrees.  Reported she has been taking antibiotic as prescribed.  Stated that she took one dose of Mucinex D, but it dried her out terribly, and made her feel worse.  Also, reported she tried one dose of Flonase, and didn't feel it helped. Care advice reviewed.  Encouraged to call back if worsening symptoms; ie: chest tightness/ congestion, shortness of breath, worsening sinus symptoms, increasing fever, or any other concerns.  Verb. Understanding.          Reason for Disposition . Earache . [1] Sinus congestion as part of a cold AND [2] present < 10 days  Answer Assessment - Initial Assessment Questions 1. LOCATION: "Where does it hurt?"      Pressure in nose and head; sore above left eye, and below left jaw 2. ONSET: "When did the sinus pain start?"  (e.g., hours, days)      Sunday, 1/13 3. SEVERITY: "How bad is the pain?"   (Scale 1-10; mild, moderate or severe)   - MILD (1-3): doesn\'t interfere with normal activities    - MODERATE (4-7): interferes with normal activities (e.g., work or school) or awakens from sleep   - SEVERE (8-10): excruciating pain and patient unable to do any normal activities        moderate 4. RECURRENT SYMPTOM: "Have you ever had sinus problems before?" If so, ask: "When was the last time?" and "What happened that time?"      Several years ago 5. NASAL CONGESTION: "Is the nose blocked?" If so, ask, "Can you open it or must you breathe through the mouth?"    Yes 6. NASAL DISCHARGE: "Do you have discharge from your nose?" If so ask, "What color?"    Clear 7. FEVER: "Do you have a fever?" If so, ask: "What is it, how was it measured, and when did it start?"      99 .9 on 1/17 early AM 8. OTHER SYMPTOMS: "Do you have any other symptoms?" (e.g., sore throat, cough, earache,  difficulty breathing)    Cough is dry, denies sore throat, shortness of breath or earache 9. PREGNANCY: "Is there any chance you are pregnant?" "When was your last menstrual period?"     No; hysterectomy  Protocols used: SINUS PAIN OR CONGESTION-A-AH  Summary: cold symptoms    Pt had an evisit 09/02/17. She has some follow up questions. She was prescribed amoxicillin. It was a constant dripping nose & drainage in eye. Yesterday she was completely dry. She was recommened to try flonase and she has used one dose of that. Her nose feels full her eyes are swollen and she is having pressure on her left lymph node. Dry cough is keeping her up at night. Pt is wanting some advice on what she needs to do. If she should needs to have another antibiotic called in or if she should try something OTC. Pt really doesn't want to come in the office and be exposed to the Flu. But she will if absolutely necessary.

## 2017-11-05 ENCOUNTER — Other Ambulatory Visit: Payer: Self-pay | Admitting: Family Medicine

## 2017-11-05 DIAGNOSIS — Z139 Encounter for screening, unspecified: Secondary | ICD-10-CM

## 2017-11-23 ENCOUNTER — Other Ambulatory Visit: Payer: Self-pay | Admitting: Family Medicine

## 2017-11-24 DIAGNOSIS — D2261 Melanocytic nevi of right upper limb, including shoulder: Secondary | ICD-10-CM | POA: Diagnosis not present

## 2017-11-24 DIAGNOSIS — D225 Melanocytic nevi of trunk: Secondary | ICD-10-CM | POA: Diagnosis not present

## 2017-11-24 DIAGNOSIS — Z85820 Personal history of malignant melanoma of skin: Secondary | ICD-10-CM | POA: Diagnosis not present

## 2017-11-25 ENCOUNTER — Ambulatory Visit: Payer: 59 | Admitting: Family Medicine

## 2018-01-10 ENCOUNTER — Emergency Department (HOSPITAL_BASED_OUTPATIENT_CLINIC_OR_DEPARTMENT_OTHER)
Admission: EM | Admit: 2018-01-10 | Discharge: 2018-01-10 | Disposition: A | Discharge status: Home / Self Care | Payer: 59 | Attending: Emergency Medicine | Admitting: Emergency Medicine

## 2018-01-10 ENCOUNTER — Encounter (HOSPITAL_BASED_OUTPATIENT_CLINIC_OR_DEPARTMENT_OTHER): Payer: Self-pay | Admitting: Emergency Medicine

## 2018-01-10 ENCOUNTER — Emergency Department (HOSPITAL_BASED_OUTPATIENT_CLINIC_OR_DEPARTMENT_OTHER): Payer: 59

## 2018-01-10 ENCOUNTER — Other Ambulatory Visit: Payer: Self-pay

## 2018-01-10 DIAGNOSIS — K5732 Diverticulitis of large intestine without perforation or abscess without bleeding: Secondary | ICD-10-CM | POA: Diagnosis not present

## 2018-01-10 DIAGNOSIS — R1032 Left lower quadrant pain: Secondary | ICD-10-CM | POA: Diagnosis present

## 2018-01-10 DIAGNOSIS — R110 Nausea: Secondary | ICD-10-CM | POA: Diagnosis not present

## 2018-01-10 DIAGNOSIS — Z79899 Other long term (current) drug therapy: Secondary | ICD-10-CM | POA: Insufficient documentation

## 2018-01-10 DIAGNOSIS — R1030 Lower abdominal pain, unspecified: Secondary | ICD-10-CM | POA: Diagnosis not present

## 2018-01-10 DIAGNOSIS — R109 Unspecified abdominal pain: Secondary | ICD-10-CM | POA: Diagnosis not present

## 2018-01-10 DIAGNOSIS — K5730 Diverticulosis of large intestine without perforation or abscess without bleeding: Secondary | ICD-10-CM | POA: Diagnosis not present

## 2018-01-10 LAB — COMPREHENSIVE METABOLIC PANEL
ALT: 62 U/L — ABNORMAL HIGH (ref 14–54)
AST: 64 U/L — ABNORMAL HIGH (ref 15–41)
Albumin: 4.1 g/dL (ref 3.5–5.0)
Alkaline Phosphatase: 116 U/L (ref 38–126)
Anion gap: 9 (ref 5–15)
BUN: 14 mg/dL (ref 6–20)
CO2: 22 mmol/L (ref 22–32)
Calcium: 9.3 mg/dL (ref 8.9–10.3)
Chloride: 105 mmol/L (ref 101–111)
Creatinine, Ser: 0.63 mg/dL (ref 0.44–1.00)
GFR calc Af Amer: >60 mL/min (ref >60)
GFR calc non Af Amer: >60 mL/min (ref >60)
Glucose, Bld: 102 mg/dL — ABNORMAL HIGH (ref 65–99)
Potassium: 4 mmol/L (ref 3.5–5.1)
Sodium: 136 mmol/L (ref 135–145)
Total Bilirubin: 0.5 mg/dL (ref 0.3–1.2)
Total Protein: 7.9 g/dL (ref 6.5–8.1)

## 2018-01-10 LAB — CBC WITH DIFFERENTIAL/PLATELET
Basophils Absolute: 0 10*3/uL (ref 0.0–0.1)
Basophils Relative: 0 %
Eosinophils Absolute: 0.1 10*3/uL (ref 0.0–0.7)
Eosinophils Relative: 1 %
HCT: 41 % (ref 36.0–46.0)
Hemoglobin: 14.2 g/dL (ref 12.0–15.0)
Lymphocytes Relative: 21 %
Lymphs Abs: 2.4 10*3/uL (ref 0.7–4.0)
MCH: 31.9 pg (ref 26.0–34.0)
MCHC: 34.6 g/dL (ref 30.0–36.0)
MCV: 92.1 fL (ref 78.0–100.0)
Monocytes Absolute: 0.9 10*3/uL (ref 0.1–1.0)
Monocytes Relative: 7 %
Neutro Abs: 8.4 10*3/uL — ABNORMAL HIGH (ref 1.7–7.7)
Neutrophils Relative %: 71 %
Platelets: 314 10*3/uL (ref 150–400)
RBC: 4.45 MIL/uL (ref 3.87–5.11)
RDW: 13.4 % (ref 11.5–15.5)
WBC: 11.8 10*3/uL — ABNORMAL HIGH (ref 4.0–10.5)

## 2018-01-10 LAB — URINALYSIS, ROUTINE W REFLEX MICROSCOPIC
Bilirubin Urine: NEGATIVE
Glucose, UA: NEGATIVE mg/dL
Ketones, ur: NEGATIVE mg/dL
Nitrite: NEGATIVE
Protein, ur: NEGATIVE mg/dL
Specific Gravity, Urine: <1.005 — ABNORMAL LOW (ref 1.005–1.030)
pH: 6.5 (ref 5.0–8.0)

## 2018-01-10 LAB — URINALYSIS, MICROSCOPIC (REFLEX)

## 2018-01-10 LAB — LIPASE, BLOOD: Lipase: 29 U/L (ref 11–51)

## 2018-01-10 MED ORDER — AMOXICILLIN-POT CLAVULANATE 875-125 MG PO TABS
1.0000 | ORAL_TABLET | Freq: Once | ORAL | Status: AC
  Administered 2018-01-10: 1 via ORAL
  Filled 2018-01-10: qty 1

## 2018-01-10 MED ORDER — HYDROCODONE-ACETAMINOPHEN 5-325 MG PO TABS
2.0000 | ORAL_TABLET | Freq: Once | ORAL | Status: AC
Start: 2018-01-10 — End: 2018-01-10
  Administered 2018-01-10: 2 via ORAL
  Filled 2018-01-10: qty 2

## 2018-01-10 MED ORDER — IOPAMIDOL (ISOVUE-300) INJECTION 61%
100.0000 mL | Freq: Once | INTRAVENOUS | Status: AC | PRN
  Administered 2018-01-10: 100 mL via INTRAVENOUS

## 2018-01-10 MED ORDER — ONDANSETRON HCL 4 MG PO TABS: 4.0000 mg | ORAL_TABLET | Freq: Four times a day (QID) | ORAL | 0 refills | Status: DC

## 2018-01-10 MED ORDER — HYDROCODONE-ACETAMINOPHEN 5-325 MG PO TABS: 1.0000 | ORAL_TABLET | Freq: Four times a day (QID) | ORAL | 0 refills | Status: DC | PRN

## 2018-01-10 MED ORDER — AMOXICILLIN-POT CLAVULANATE 875-125 MG PO TABS: 1.0000 | ORAL_TABLET | Freq: Three times a day (TID) | ORAL | 0 refills | Status: DC

## 2018-01-10 NOTE — ED Triage Notes (Signed)
Patient is having lower left abdominal pain and left flank pain. The patient reports Nausea  - denies any blood - patient states that she has had this pain since wed

## 2018-01-10 NOTE — Discharge Instructions (Addendum)
Medications: Augmentin, Zofran, Norco  Treatment: Take Augmentin 3 times daily as prescribed.  Take Zofran every 6 hours as needed for nausea or vomiting.  Take 1-2 Norco every 6 hours as needed for severe pain.  Follow-up: Please follow-up with your doctor in 3 to 4 days for recheck.  Please return to the emergency department if you develop any new or worsening symptoms.

## 2018-01-10 NOTE — ED Notes (Signed)
ED Provider at bedside. 

## 2018-01-10 NOTE — ED Provider Notes (Signed)
[facility] EM[facility]GENCY DEPARTMENT Provider Note   CSN: [id number] Arrival date & time: 01/10/18  1546     History   Chief Complaint Chief Complaint  Patient presents with  . Abdominal Pain    HPI Laura Maldonado is a 52 y.o. female with history of MS, GERD, diverticulitis who presents with a 5-day history of left lower quadrant pain.  Patient has had associated nausea and bloating.  She denies any change in bowel movements or bloody stools.  Her pain is constant, but worse with movement, sitting, and the urge to urinate.  She denies any dysuria or other urinary symptoms.  She just states that when she does feel like she has to go the bathroom, the pressure makes the pain worse.  She does not remember if her symptoms feel similar to her last flare of diverticulitis, as it was several years ago.  Patient has taken Tylenol at home without significant relief.  Patient presented to urgent care prior to arrival who sent her here for further evaluation.  Patient denies any fevers, chest pain, shortness of breath, vomiting.  Patient is status post hysterectomy.  HPI  Past Medical History:  Diagnosis Date  . Allergy   . Anxiety   . Cancer ([place])    melanoma  . Chicken pox   . Diverticulitis   . Frequent headaches   . GERD (gastroesophageal reflux disease)   . Heart murmur    past hx detected by one MD  . Migraines   . Multiple sclerosis ([place]) 05/29/2016   [facility] q 6 months.  . Neuromuscular disorder ([place])    MS  . Obesity (BMI 30-39.9) 01/15/2017  . Pure hypercholesterolemia 05/29/2016  . Vitamin D deficiency 05/29/2016    Patient Active Problem List   Diagnosis Date Noted  . Chronic nonintractable headache 03/08/2017  . Moderate episode of recurrent major depressive disorder ([place]) 03/08/2017  . Obesity (BMI 30-39.9) 01/15/2017  . Vitamin D deficiency 05/29/2016  . Pure hypercholesterolemia 05/29/2016  . Multiple sclerosis ([place]) 05/29/2016    Past Surgical  History:  Procedure Laterality Date  . ABDOMINAL HYSTERECTOMY    . BREAST LUMPECTOMY Left 2007  . CESAREAN SECTION    . CHOLECYSTECTOMY    . COLONOSCOPY    . MELANOMA EXCISION  1986  . MOLE REMOVAL    . UPPER GASTROINTESTINAL ENDOSCOPY       OB History   None      Home Medications    Prior to Admission medications   Medication Sig Start Date End Date Taking? Authorizing Provider  amoxicillin-clavulanate (AUGMENTIN) 875-125 MG tablet Take 1 tablet by mouth every 8 (eight) hours. 01/10/18   Walker Paddack, Bea Graff, PA-C  B Complex Vitamins (B COMPLEX 100 PO) Take 1 capsule by mouth daily.    [provider]  BIOTIN PO Take 1 capsule by mouth daily.    [provider]  Calcium Carb-Cholecalciferol (CALCIUM 1000 + D PO) Take 1 tablet by mouth daily.    [provider]  ciclopirox (PENLAC) 8 % solution Apply topically. 10/30/16   [provider]  clotrimazole-betamethasone (LOTRISONE) cream Apply 1 application topically 2 (two) times daily. 01/14/17   Briscoe Deutscher, DO  CRANBERRY PO Take by mouth daily. 1-2 a day    [provider]  cyclobenzaprine (FLEXERIL) 5 MG tablet Take 0.5-1 tablets (2.5-5 mg total) by mouth 2 (two) times daily as needed for muscle spasms. Patient not taking: Reported on 07/23/2017 04/13/17   Clarene Reamer  B, FNP  Flaxseed, Linseed, (FLAXSEED OIL PO) Take by mouth.    [provider]  HYDROcodone-acetaminophen (NORCO/VICODIN) 5-325 MG tablet Take 1-2 tablets by mouth every 6 (six) hours as needed. 01/10/18   Baneen Wieseler, Bea Graff, PA-C  ibuprofen (ADVIL,MOTRIN) 200 MG tablet Take by mouth.    [provider]  LORazepam (ATIVAN) 0.5 MG tablet Take 1 tablet (0.5 mg total) by mouth every 8 (eight) hours. 07/30/17   Briscoe Deutscher, DO  Magnesium 500 MG TABS Take by mouth.    [provider]  modafinil (PROVIGIL) 200 MG tablet Take 1 tablet (200 mg total) by mouth daily. 05/28/17   Briscoe Deutscher, DO    ondansetron (ZOFRAN) 4 MG tablet Take 1 tablet (4 mg total) by mouth every 6 (six) hours. 01/10/18   Frederica Kuster, PA-C  Probiotic Product (PROBIOTIC MATURE ADULT) CAPS Take by mouth.    [provider]  TROKENDI XR 25 MG CP24 TAKE 1 CAPSULE BY MOUTH EVERYDAY AT BEDTIME 11/24/17   Briscoe Deutscher, DO  Turmeric 500 MG CAPS Take by mouth.    [provider]    Family History Family History  Problem Relation Age of Onset  . Hypertension Mother   . Heart disease Father   . Hypertension Maternal Grandmother   . Hypertension Maternal Grandfather   . Hypertension Paternal Grandmother   . Hypertension Paternal Grandfather   . Colon cancer Neg Hx   . Colon polyps Neg Hx   . Esophageal cancer Neg Hx   . Rectal cancer Neg Hx   . Stomach cancer Neg Hx     Social History Social History   Tobacco Use  . Smoking status: Never Smoker  . Smokeless tobacco: Never Used  Substance Use Topics  . Alcohol use: Yes    Comment: rare- maybe 5 x a year   . Drug use: No     Allergies   Shellfish allergy; Interferons; Codeine; Flagyl [metronidazole]; Gabapentin; Gluten meal; Lactose; and Lamotrigine   Review of Systems Review of Systems  Constitutional: Negative for chills and fever.  HENT: Negative for facial swelling and sore throat.   Respiratory: Negative for shortness of breath.   Cardiovascular: Negative for chest pain.  Gastrointestinal: Positive for abdominal pain and nausea. Negative for vomiting.  Genitourinary: Negative for dysuria and flank pain.  Musculoskeletal: Negative for back pain.  Skin: Negative for rash and wound.  Neurological: Negative for headaches.  Psychiatric/Behavioral: The patient is not nervous/anxious.      Physical Exam Updated Vital Signs BP 134/87 (BP Location: Left Arm)   Pulse 74   Resp 20   Ht 5' 5.5" (1.664 m)   Wt 100.7 kg (222 lb)   SpO2 100%   BMI 36.38 kg/m   Physical Exam  Constitutional: She appears well-developed  and well-nourished. No distress.  HENT:  Head: Normocephalic and atraumatic.  Mouth/Throat: Oropharynx is clear and moist. No oropharyngeal exudate.  Eyes: Pupils are equal, round, and reactive to light. Conjunctivae are normal. Right eye exhibits no discharge. Left eye exhibits no discharge. No scleral icterus.  Neck: Normal range of motion. Neck supple. No thyromegaly present.  Cardiovascular: Normal rate, regular rhythm, normal heart sounds and intact distal pulses. Exam reveals no gallop and no friction rub.  No murmur heard. Pulmonary/Chest: Effort normal and breath sounds normal. No stridor. No respiratory distress. She has no wheezes. She has no rales.  Abdominal: Soft. Bowel sounds are normal. She exhibits no distension. There is tenderness in  the left lower quadrant. There is no rebound, no guarding and no CVA tenderness.  Musculoskeletal: She exhibits no edema.  Lymphadenopathy:    She has no cervical adenopathy.  Neurological: She is alert. Coordination normal.  Skin: Skin is warm and dry. No rash noted. She is not diaphoretic. No pallor.  Psychiatric: She has a normal mood and affect.  Nursing note and vitals reviewed.    ED Treatments / Results  Labs (all labs ordered are listed, but only abnormal results are displayed) Labs Reviewed  COMPREHENSIVE METABOLIC PANEL - Abnormal; Notable for the following components:      Result Value   Glucose, Bld 102 (*)    AST 64 (*)    ALT 62 (*)    All other components within normal limits  CBC WITH DIFFERENTIAL/PLATELET - Abnormal; Notable for the following components:   WBC 11.8 (*)    Neutro Abs 8.4 (*)    All other components within normal limits  URINALYSIS, ROUTINE W REFLEX MICROSCOPIC - Abnormal; Notable for the following components:   Specific Gravity, Urine <1.005 (*)    Hgb urine dipstick TRACE (*)    Leukocytes, UA SMALL (*)    All other components within normal limits  URINALYSIS, MICROSCOPIC (REFLEX) - Abnormal;  Notable for the following components:   Bacteria, UA FEW (*)    All other components within normal limits  LIPASE, BLOOD    EKG None  Radiology Ct Abdomen Pelvis W Contrast  Result Date: 01/10/2018 CLINICAL DATA:  Left lower quadrant pain.  History of melanoma EXAM: CT ABDOMEN AND PELVIS WITH CONTRAST TECHNIQUE: Multidetector CT imaging of the abdomen and pelvis was performed using the standard protocol following bolus administration of intravenous contrast. CONTRAST:  158mL ISOVUE-300 IOPAMIDOL (ISOVUE-300) INJECTION 61% COMPARISON:  None. FINDINGS: Lower chest: Lung bases are clear. Hepatobiliary: No focal liver lesions are evident. Gallbladder is absent. There is no biliary duct dilatation. Pancreas: No pancreatic mass or inflammatory focus. Spleen: No splenic lesions are evident. Adrenals/Urinary Tract: Adrenals bilaterally appear normal. Kidneys bilaterally show no evident mass or hydronephrosis on either side. No evident renal or ureteral calculus on either side. Urinary bladder is midline with wall thickness within normal limits. Stomach/Bowel: There is diverticulitis at the junction of the descending colon and sigmoid colon with irregular diverticula as well as mild fluid and mesenteric thickening in this area. There is mild colon wall thickening in this area of diverticulitis. No abscess or perforation is seen in this area diverticulitis. There are multiple diverticula throughout the remainder of the sigmoid colon without diverticulitis elsewhere. There is no evident bowel obstruction. No free air or portal venous air. Vascular/Lymphatic: No abdominal aortic aneurysm. No vascular lesions are evident in the abdomen or pelvis. There is no adenopathy in the abdomen or pelvis. Reproductive: Uterus is absent.  No evident pelvic mass. Other: Appendix appears normal. There is no abscess or ascites in the abdomen or pelvis. There is a small ventral hernia containing only fat. Musculoskeletal: There are  no blastic or lytic bone lesions. There is no intramuscular or abdominal wall lesion evident. IMPRESSION: 1. Focal diverticulitis at the junction of the descending colon and sigmoid colon. No perforation or abscess noted in this region. There is sigmoid diverticulosis elsewhere in the sigmoid colon without diverticulitis elsewhere. 2.  No bowel obstruction.  No abscess.  Appendix appears normal. 3.  No renal or ureteral calculus.  No hydronephrosis. 4.  Gallbladder absent.  Uterus absent. 5.  Small ventral hernia containing  only fat. Electronically Signed   By: Lowella Grip III M.D.   On: 01/10/2018 17:42    Procedures Procedures (including critical care time)  Medications Ordered in ED Medications  iopamidol (ISOVUE-300) 61 % injection 100 mL (100 mLs Intravenous Contrast Given 01/10/18 1719)     Initial Impression / Assessment and Plan / ED Course  I have reviewed the triage vital signs and the nursing notes.  Pertinent labs & imaging results that were available during my care of the patient were reviewed by me and considered in my medical decision making (see chart for details).     Patient with diverticulitis without complication.  CBC shows WBC 11.8.  CMP shows a mild elevation in LFTs, AST 64, ALT 62.  Lipase negative.  UA shows trace hematuria, small leukocytes, few bacteria.  No urinary symptoms to concern for UTI.  CT of the pelvis shows focal diverticulitis at the junction of the descending colon and sigmoid colon, no perforation or abscess.  Patient reports Flagyl gives her migraines.  Will treat with Augmentin.  Follow-up to PCP in 3 to 4 days for recheck.  We will also discharge home with Norco for pain control and Zofran.  I reviewed the [place] narcotic database and found no discrepancies.  Strict return precautions given.  Patient understands and agrees with plan.  Patient vitals stable throughout ED course and discharged in satisfactory condition.  Final Clinical Impressions(s) /  ED Diagnoses   Final diagnoses:  Diverticulitis large intestine w/o perforation or abscess w/o bleeding    ED Discharge Orders        Ordered    amoxicillin-clavulanate (AUGMENTIN) 875-125 MG tablet  Every 8 hours     01/10/18 1810    HYDROcodone-acetaminophen (NORCO/VICODIN) 5-325 MG tablet  Every 6 hours PRN     01/10/18 1810    ondansetron (ZOFRAN) 4 MG tablet  Every 6 hours     01/10/18 1811       Frederica Kuster, PA-C 01/10/18 Velta Addison    Charlesetta Shanks, MD 01/19/18 1441

## 2018-01-12 ENCOUNTER — Ambulatory Visit (INDEPENDENT_AMBULATORY_CARE_PROVIDER_SITE_OTHER): Payer: 59 | Admitting: Family Medicine

## 2018-01-12 ENCOUNTER — Encounter: Payer: Self-pay | Admitting: Family Medicine

## 2018-01-12 VITALS — BP 114/72 | HR 74 | Temp 98.1°F | Ht 65.5 in | Wt 224.0 lb

## 2018-01-12 DIAGNOSIS — K5792 Diverticulitis of intestine, part unspecified, without perforation or abscess without bleeding: Secondary | ICD-10-CM

## 2018-01-12 MED ORDER — KETOROLAC TROMETHAMINE 10 MG PO TABS: 10.0000 mg | ORAL_TABLET | Freq: Three times a day (TID) | ORAL | 0 refills | Status: DC | PRN

## 2018-01-12 NOTE — Progress Notes (Signed)
Laura Maldonado is a 52 y.o. female is here for follow up.  History of Present Illness:  [name] CMA acting as scribe for Dr. [name].  HPI: Patient comes in today for follow up for an ED visit. She was seen for abdominal pain. She was seen at [facility] urgent care on Sunday. They sent the patient to the ED.   Diverticulitis: Patient was seen in the ED for Abdominal pain. She had a CT scan that showed diverticulitis. She was put on Augmentin for this.  She was given Norco for pain. She has stopped taking the Norco due to constipation. She has been taking Tylenol for the pain. Discussed trying Toradol for the pain and if she has break through pain take the Norco. Continue to take the Zofran for nausea.    Health Maintenance Due  Topic Date Due  . HIV Screening  03/22/1980   Depression screen [facility] 2/9 05/28/2017 05/28/2017  Decreased Interest 1 1  PHQ - 2 Score 1 1  Altered sleeping 0 -  Tired, decreased energy 2 -  Change in appetite 0 -  Feeling bad or failure about yourself  0 -  Trouble concentrating 1 -  Moving slowly or fidgety/restless 0 -  Suicidal thoughts 0 -  PHQ-9 Score 4 -   PMHx, SurgHx, SocialHx, FamHx, Medications, and Allergies were reviewed in the Visit Navigator and updated as appropriate.   Patient Active Problem List   Diagnosis Date Noted  . Chronic nonintractable headache 03/08/2017  . Moderate episode of recurrent major depressive disorder ([place]) 03/08/2017  . Obesity (BMI 30-39.9) 01/15/2017  . Vitamin D deficiency 05/29/2016  . Pure hypercholesterolemia 05/29/2016  . Multiple sclerosis ([place]) 05/29/2016   Social History   Tobacco Use  . Smoking status: Never Smoker  . Smokeless tobacco: Never Used  Substance Use Topics  . Alcohol use: Yes    Comment: rare- maybe 5 x a year   . Drug use: No   Current Medications and Allergies:   Current Outpatient Medications:  .  amoxicillin-clavulanate (AUGMENTIN) 875-125 MG tablet, Take 1 tablet by mouth  every 8 (eight) hours., Disp: 21 tablet, Rfl: 0 .  B Complex Vitamins (B COMPLEX 100 PO), Take 1 capsule by mouth daily., Disp: , Rfl:  .  BIOTIN PO, Take 1 capsule by mouth daily., Disp: , Rfl:  .  Calcium Carb-Cholecalciferol (CALCIUM 1000 + D PO), Take 1 tablet by mouth daily., Disp: , Rfl:  .  ciclopirox (PENLAC) 8 % solution, Apply topically., Disp: , Rfl:  .  clotrimazole-betamethasone (LOTRISONE) cream, Apply 1 application topically 2 (two) times daily., Disp: 30 g, Rfl: 0 .  CRANBERRY PO, Take by mouth daily. 1-2 a day, Disp: , Rfl:  .  cyclobenzaprine (FLEXERIL) 5 MG tablet, Take 0.5-1 tablets (2.5-5 mg total) by mouth 2 (two) times daily as needed for muscle spasms., Disp: 12 tablet, Rfl: 1 .  Flaxseed, Linseed, (FLAXSEED OIL PO), Take by mouth., Disp: , Rfl:  .  HYDROcodone-acetaminophen (NORCO/VICODIN) 5-325 MG tablet, Take 1-2 tablets by mouth every 6 (six) hours as needed., Disp: 10 tablet, Rfl: 0 .  ibuprofen (ADVIL,MOTRIN) 200 MG tablet, Take by mouth., Disp: , Rfl:  .  LORazepam (ATIVAN) 0.5 MG tablet, Take 1 tablet (0.5 mg total) by mouth every 8 (eight) hours., Disp: 30 tablet, Rfl: 0 .  Magnesium 500 MG TABS, Take by mouth., Disp: , Rfl:  .  modafinil (PROVIGIL) 200 MG tablet, Take 1 tablet (200 mg total) by  mouth daily., Disp: 30 tablet, Rfl: 0 .  ondansetron (ZOFRAN) 4 MG tablet, Take 1 tablet (4 mg total) by mouth every 6 (six) hours., Disp: 12 tablet, Rfl: 0 .  Probiotic Product (PROBIOTIC MATURE ADULT) CAPS, Take by mouth., Disp: , Rfl:  .  TROKENDI XR 25 MG CP24, TAKE 1 CAPSULE BY MOUTH EVERYDAY AT BEDTIME, Disp: 90 capsule, Rfl: 0 .  Turmeric 500 MG CAPS, Take by mouth., Disp: , Rfl:   Current Facility-Administered Medications:  .  0.9 %  sodium chloride infusion, 500 mL, Intravenous, Once, Gatha Mayer, MD   Allergies  Allergen Reactions  . Shellfish Allergy Shortness Of Breath  . Interferons Other (See Comments)    Chest pains, burning trouble breathing  itching  . Codeine Nausea Only  . Flagyl [Metronidazole] Other (See Comments)    Migraines   . Gabapentin Hives  . Gluten Meal Diarrhea and Other (See Comments)    Stomach pain   . Lactose     Other reaction(s): Unknown  . Lamotrigine Hives   Review of Systems   Pertinent items are noted in the HPI. Otherwise, ROS is negative.  Vitals:   Vitals:   01/12/18 1451  BP: 114/72  Pulse: 74  Temp: 98.1 F (36.7 C)  TempSrc: Oral  SpO2: 97%  Weight: 224 lb (101.6 kg)  Height: 5' 5.5" (1.664 m)     Body mass index is 36.71 kg/m.  Physical Exam:   Physical Exam  Constitutional: She is oriented to person, place, and time. She appears well-developed and well-nourished. No distress.  HENT:  Head: Normocephalic and atraumatic.  Right Ear: External ear normal.  Left Ear: External ear normal.  Nose: Nose normal.  Mouth/Throat: Oropharynx is clear and moist.  Eyes: Pupils are equal, round, and reactive to light. Conjunctivae and EOM are normal.  Neck: Normal range of motion. Neck supple. No thyromegaly present.  Cardiovascular: Normal rate, regular rhythm, normal heart sounds and intact distal pulses.  Pulmonary/Chest: Effort normal and breath sounds normal.  Abdominal: Soft. Bowel sounds are normal.  Musculoskeletal: Normal range of motion.  Lymphadenopathy:    She has no cervical adenopathy.  Neurological: She is alert and oriented to person, place, and time.  Skin: Skin is warm and dry. Capillary refill takes less than 2 seconds.  Psychiatric: She has a normal mood and affect. Her behavior is normal.  Nursing note and vitals reviewed.  Results for orders placed or performed during the hospital encounter of 01/10/18  Lipase, blood  Result Value Ref Range   Lipase 29 11 - 51 U/L  Comprehensive metabolic panel  Result Value Ref Range   Sodium 136 135 - 145 mmol/L   Potassium 4.0 3.5 - 5.1 mmol/L   Chloride 105 101 - 111 mmol/L   CO2 22 22 - 32 mmol/L   Glucose, Bld 102  (H) 65 - 99 mg/dL   BUN 14 6 - 20 mg/dL   Creatinine, Ser 0.63 0.44 - 1.00 mg/dL   Calcium 9.3 8.9 - 10.3 mg/dL   Total Protein 7.9 6.5 - 8.1 g/dL   Albumin 4.1 3.5 - 5.0 g/dL   AST 64 (H) 15 - 41 U/L   ALT 62 (H) 14 - 54 U/L   Alkaline Phosphatase 116 38 - 126 U/L   Total Bilirubin 0.5 0.3 - 1.2 mg/dL   GFR calc non Af Amer >60 >60 mL/min   GFR calc Af Amer >60 >60 mL/min   Anion gap 9 5 -  15  CBC with Differential  Result Value Ref Range   WBC 11.8 (H) 4.0 - 10.5 K/uL   RBC 4.45 3.87 - 5.11 MIL/uL   Hemoglobin 14.2 12.0 - 15.0 g/dL   HCT 41.0 36.0 - 46.0 %   MCV 92.1 78.0 - 100.0 fL   MCH 31.9 26.0 - 34.0 pg   MCHC 34.6 30.0 - 36.0 g/dL   RDW 13.4 11.5 - 15.5 %   Platelets 314 150 - 400 K/uL   Neutrophils Relative % 71 %   Neutro Abs 8.4 (H) 1.7 - 7.7 K/uL   Lymphocytes Relative 21 %   Lymphs Abs 2.4 0.7 - 4.0 K/uL   Monocytes Relative 7 %   Monocytes Absolute 0.9 0.1 - 1.0 K/uL   Eosinophils Relative 1 %   Eosinophils Absolute 0.1 0.0 - 0.7 K/uL   Basophils Relative 0 %   Basophils Absolute 0.0 0.0 - 0.1 K/uL  Urinalysis, Routine w reflex microscopic  Result Value Ref Range   Color, Urine YELLOW YELLOW   APPearance CLEAR CLEAR   Specific Gravity, Urine <1.005 (L) 1.005 - 1.030   pH 6.5 5.0 - 8.0   Glucose, UA NEGATIVE NEGATIVE mg/dL   Hgb urine dipstick TRACE (A) NEGATIVE   Bilirubin Urine NEGATIVE NEGATIVE   Ketones, ur NEGATIVE NEGATIVE mg/dL   Protein, ur NEGATIVE NEGATIVE mg/dL   Nitrite NEGATIVE NEGATIVE   Leukocytes, UA SMALL (A) NEGATIVE  Urinalysis, Microscopic (reflex)  Result Value Ref Range   RBC / HPF 0-5 0 - 5 RBC/hpf   WBC, UA 0-5 0 - 5 WBC/hpf   Bacteria, UA FEW (A) NONE SEEN   Squamous Epithelial / LPF 0-5 0 - 5    Assessment and Plan:   Maurina was seen today for diverticulitis.  Diagnoses and all orders for this visit:  Diverticulitis Comments: Improving. Reviewed diagnosis and treatment as well as red flags. Patient had multiple  questions that we addressed.  Orders: -     ketorolac (TORADOL) 10 MG tablet; Take 1 tablet (10 mg total) by mouth every 8 (eight) hours as needed.    . Reviewed expectations re: course of current medical issues. . Discussed self-management of symptoms. . Outlined signs and symptoms indicating need for more acute intervention. . Patient verbalized understanding and all questions were answered. Marland Kitchen Health Maintenance issues including appropriate healthy diet, exercise, and smoking avoidance were discussed with patient. . See orders for this visit as documented in the electronic medical record. . Patient received an After Visit Summary.  CMA served as Education administrator during this visit. History, Physical, and Plan performed by medical provider. The above documentation has been reviewed and is accurate and complete. Briscoe Deutscher, D.O.  Briscoe Deutscher, DO [place], [facility] 01/24/2018

## 2018-01-13 ENCOUNTER — Ambulatory Visit: Payer: 59 | Admitting: Family Medicine

## 2018-01-19 ENCOUNTER — Encounter: Payer: Self-pay | Admitting: Family Medicine

## 2018-01-19 ENCOUNTER — Other Ambulatory Visit: Payer: Self-pay

## 2018-01-19 MED ORDER — AMOXICILLIN-POT CLAVULANATE 875-125 MG PO TABS: 1.0000 | ORAL_TABLET | Freq: Three times a day (TID) | ORAL | 0 refills | Status: DC

## 2018-01-19 NOTE — Telephone Encounter (Signed)
Copied from [place] [id number]. Topic: General - Other >> Jan 19, 2018  1:01 PM Synthia Innocent wrote: Reason for CRM: Patient checking status of mychart message

## 2018-01-19 NOTE — Telephone Encounter (Signed)
Patient came by office. Informed I will call in script now confirmed pharmacy.

## 2018-01-24 ENCOUNTER — Encounter: Payer: Self-pay | Admitting: Family Medicine

## 2018-01-25 ENCOUNTER — Encounter: Payer: 59 | Admitting: Family Medicine

## 2018-02-02 ENCOUNTER — Encounter: Payer: 59 | Admitting: Family Medicine

## 2018-02-07 ENCOUNTER — Encounter: Payer: Self-pay | Admitting: Family Medicine

## 2018-02-08 ENCOUNTER — Encounter (HOSPITAL_COMMUNITY): Payer: Self-pay

## 2018-02-08 ENCOUNTER — Encounter: Payer: Self-pay | Admitting: Family Medicine

## 2018-02-08 ENCOUNTER — Ambulatory Visit (INDEPENDENT_AMBULATORY_CARE_PROVIDER_SITE_OTHER): Payer: 59 | Admitting: Family Medicine

## 2018-02-08 ENCOUNTER — Ambulatory Visit (HOSPITAL_COMMUNITY)
Admission: RE | Admit: 2018-02-08 | Discharge: 2018-02-08 | Disposition: A | Discharge status: Home / Self Care | Payer: 59 | Source: Ambulatory Visit | Attending: Family Medicine | Admitting: Family Medicine

## 2018-02-08 VITALS — BP 116/76 | HR 71 | Temp 98.2°F | Ht 65.5 in | Wt 222.2 lb

## 2018-02-08 DIAGNOSIS — R109 Unspecified abdominal pain: Secondary | ICD-10-CM | POA: Diagnosis not present

## 2018-02-08 DIAGNOSIS — R1032 Left lower quadrant pain: Secondary | ICD-10-CM | POA: Diagnosis not present

## 2018-02-08 DIAGNOSIS — K5792 Diverticulitis of intestine, part unspecified, without perforation or abscess without bleeding: Secondary | ICD-10-CM

## 2018-02-08 DIAGNOSIS — K5732 Diverticulitis of large intestine without perforation or abscess without bleeding: Secondary | ICD-10-CM | POA: Diagnosis not present

## 2018-02-08 LAB — CBC WITH DIFFERENTIAL/PLATELET
Basophils Absolute: 0.1 10*3/uL (ref 0.0–0.1)
Basophils Relative: 0.7 % (ref 0.0–3.0)
Eosinophils Absolute: 0.1 10*3/uL (ref 0.0–0.7)
Eosinophils Relative: 0.9 % (ref 0.0–5.0)
HCT: 39.5 % (ref 36.0–46.0)
Hemoglobin: 13.6 g/dL (ref 12.0–15.0)
Lymphocytes Relative: 28.5 % (ref 12.0–46.0)
Lymphs Abs: 2 10*3/uL (ref 0.7–4.0)
MCHC: 34.3 g/dL (ref 30.0–36.0)
MCV: 92.1 fl (ref 78.0–100.0)
Monocytes Absolute: 0.7 10*3/uL (ref 0.1–1.0)
Monocytes Relative: 9.7 % (ref 3.0–12.0)
Neutro Abs: 4.2 10*3/uL (ref 1.4–7.7)
Neutrophils Relative %: 60.2 % (ref 43.0–77.0)
Platelets: 263 10*3/uL (ref 150.0–400.0)
RBC: 4.29 Mil/uL (ref 3.87–5.11)
RDW: 13.4 % (ref 11.5–15.5)
WBC: 7.1 10*3/uL (ref 4.0–10.5)

## 2018-02-08 LAB — COMPREHENSIVE METABOLIC PANEL
ALT: 22 U/L (ref 0–35)
AST: 29 U/L (ref 0–37)
Albumin: 4.3 g/dL (ref 3.5–5.2)
Alkaline Phosphatase: 86 U/L (ref 39–117)
BUN: 15 mg/dL (ref 6–23)
CO2: 27 mEq/L (ref 19–32)
Calcium: 9.9 mg/dL (ref 8.4–10.5)
Chloride: 103 mEq/L (ref 96–112)
Creatinine, Ser: 0.64 mg/dL (ref 0.40–1.20)
GFR: 103.22 mL/min (ref >60.00)
Glucose, Bld: 93 mg/dL (ref 70–99)
Potassium: 4.1 mEq/L (ref 3.5–5.1)
Sodium: 137 mEq/L (ref 135–145)
Total Bilirubin: 0.4 mg/dL (ref 0.2–1.2)
Total Protein: 7.7 g/dL (ref 6.0–8.3)

## 2018-02-08 MED ORDER — MOXIFLOXACIN HCL 400 MG PO TABS: 400.0000 mg | ORAL_TABLET | Freq: Every day | ORAL | 0 refills | Status: DC

## 2018-02-08 MED ORDER — IOPAMIDOL (ISOVUE-300) INJECTION 61%
100.0000 mL | Freq: Once | INTRAVENOUS | Status: AC | PRN
  Administered 2018-02-08: 100 mL via INTRAVENOUS

## 2018-02-08 MED ORDER — IOPAMIDOL (ISOVUE-300) INJECTION 61%
INTRAVENOUS | Status: AC
  Filled 2018-02-08: qty 100

## 2018-02-08 NOTE — Progress Notes (Signed)
Laura Maldonado is a 52 y.o. female here for an acute visit.  History of Present Illness:   HPI: Hx of recent new diagnosis of diverticulitis, s/p treatment. See image results below from the ER visit.   01/10/18: IMPRESSION: 1. Focal diverticulitis at the junction of the descending colon and sigmoid colon. No perforation or abscess noted in this region. There is sigmoid diverticulosis elsewhere in the sigmoid colon without diverticulitis elsewhere.  Now, having worsening LLQ pain. No guarding. Some decreased BMs. No fever. Keeping hydrated.   PMHx, SurgHx, SocialHx, Medications, and Allergies were reviewed in the Visit Navigator and updated as appropriate.  Current Medications:   Current Outpatient Medications:  .  B Complex Vitamins (B COMPLEX 100 PO), Take 1 capsule by mouth daily., Disp: , Rfl:  .  BIOTIN PO, Take 1 capsule by mouth daily., Disp: , Rfl:  .  Calcium Carb-Cholecalciferol (CALCIUM 1000 + D PO), Take 1 tablet by mouth daily., Disp: , Rfl:  .  ciclopirox (PENLAC) 8 % solution, Apply topically., Disp: , Rfl:  .  clotrimazole-betamethasone (LOTRISONE) cream, Apply 1 application topically 2 (two) times daily., Disp: 30 g, Rfl: 0 .  CRANBERRY PO, Take by mouth daily. 1-2 a day, Disp: , Rfl:  .  cyclobenzaprine (FLEXERIL) 5 MG tablet, Take 0.5-1 tablets (2.5-5 mg total) by mouth 2 (two) times daily as needed for muscle spasms., Disp: 12 tablet, Rfl: 1 .  Flaxseed, Linseed, (FLAXSEED OIL PO), Take by mouth., Disp: , Rfl:  .  ibuprofen (ADVIL,MOTRIN) 200 MG tablet, Take by mouth., Disp: , Rfl:  .  ketorolac (TORADOL) 10 MG tablet, Take 1 tablet (10 mg total) by mouth every 8 (eight) hours as needed., Disp: 20 tablet, Rfl: 0 .  LORazepam (ATIVAN) 0.5 MG tablet, Take 1 tablet (0.5 mg total) by mouth every 8 (eight) hours., Disp: 30 tablet, Rfl: 0 .  Magnesium 500 MG TABS, Take by mouth., Disp: , Rfl:  .  modafinil (PROVIGIL) 200 MG tablet, Take 1 tablet (200 mg total) by mouth  daily., Disp: 30 tablet, Rfl: 0 .  ondansetron (ZOFRAN) 4 MG tablet, Take 1 tablet (4 mg total) by mouth every 6 (six) hours., Disp: 12 tablet, Rfl: 0 .  Probiotic Product (PROBIOTIC MATURE ADULT) CAPS, Take by mouth., Disp: , Rfl:  .  TROKENDI XR 25 MG CP24, TAKE 1 CAPSULE BY MOUTH EVERYDAY AT BEDTIME, Disp: 90 capsule, Rfl: 0 .  Turmeric 500 MG CAPS, Take by mouth., Disp: , Rfl:  .  amoxicillin-clavulanate (AUGMENTIN) 875-125 MG tablet, Take 1 tablet by mouth 2 (two) times daily., Disp: 20 tablet, Rfl: 0   Allergies  Allergen Reactions  . Shellfish Allergy Shortness Of Breath  . Interferons Other (See Comments)    Chest pains, burning trouble breathing itching  . Codeine Nausea Only  . Flagyl [Metronidazole] Other (See Comments)    Migraines   . Gabapentin Hives  . Gluten Meal Diarrhea and Other (See Comments)    Stomach pain   . Lactose     Other reaction(s): Unknown  . Lamotrigine Hives   Review of Systems:   Pertinent items are noted in the HPI. Otherwise, ROS is negative.  Vitals:   Vitals:   02/08/18 1132  BP: 116/76  Pulse: 71  Temp: 98.2 F (36.8 C)  TempSrc: Oral  SpO2: 100%  Weight: 222 lb 3.2 oz (100.8 kg)  Height: 5' 5.5" (1.664 m)     Body mass index is 36.41 kg/m.  Physical Exam:  Physical Exam  Constitutional: She is oriented to person, place, and time. She appears well-developed and well-nourished. No distress.  HENT:  Head: Normocephalic and atraumatic.  Right Ear: External ear normal.  Left Ear: External ear normal.  Nose: Nose normal.  Mouth/Throat: Oropharynx is clear and moist.  Eyes: Pupils are equal, round, and reactive to light. Conjunctivae and EOM are normal.  Neck: Normal range of motion. Neck supple. No thyromegaly present.  Cardiovascular: Normal rate, regular rhythm, normal heart sounds and intact distal pulses.  Pulmonary/Chest: Effort normal and breath sounds normal.  Abdominal: Soft. Bowel sounds are normal. There is  tenderness in the left lower quadrant. There is no rigidity, no rebound and no guarding.  Musculoskeletal: Normal range of motion.  Lymphadenopathy:    She has no cervical adenopathy.  Neurological: She is alert and oriented to person, place, and time.  Skin: Skin is warm and dry. Capillary refill takes less than 2 seconds.  Psychiatric: She has a normal mood and affect. Her behavior is normal.  Nursing note and vitals reviewed.    Results for orders placed or performed in visit on 02/08/18  CBC with Differential/Platelet  Result Value Ref Range   WBC 7.1 4.0 - 10.5 K/uL   RBC 4.29 3.87 - 5.11 Mil/uL   Hemoglobin 13.6 12.0 - 15.0 g/dL   HCT 39.5 36.0 - 46.0 %   MCV 92.1 78.0 - 100.0 fl   MCHC 34.3 30.0 - 36.0 g/dL   RDW 13.4 11.5 - 15.5 %   Platelets 263.0 150.0 - 400.0 K/uL   Neutrophils Relative % 60.2 43.0 - 77.0 %   Lymphocytes Relative 28.5 12.0 - 46.0 %   Monocytes Relative 9.7 3.0 - 12.0 %   Eosinophils Relative 0.9 0.0 - 5.0 %   Basophils Relative 0.7 0.0 - 3.0 %   Neutro Abs 4.2 1.4 - 7.7 K/uL   Lymphs Abs 2.0 0.7 - 4.0 K/uL   Monocytes Absolute 0.7 0.1 - 1.0 K/uL   Eosinophils Absolute 0.1 0.0 - 0.7 K/uL   Basophils Absolute 0.1 0.0 - 0.1 K/uL  Comprehensive metabolic panel  Result Value Ref Range   Sodium 137 135 - 145 mEq/L   Potassium 4.1 3.5 - 5.1 mEq/L   Chloride 103 96 - 112 mEq/L   CO2 27 19 - 32 mEq/L   Glucose, Bld 93 70 - 99 mg/dL   BUN 15 6 - 23 mg/dL   Creatinine, Ser 0.64 0.40 - 1.20 mg/dL   Total Bilirubin 0.4 0.2 - 1.2 mg/dL   Alkaline Phosphatase 86 39 - 117 U/L   AST 29 0 - 37 U/L   ALT 22 0 - 35 U/L   Total Protein 7.7 6.0 - 8.3 g/dL   Albumin 4.3 3.5 - 5.2 g/dL   Calcium 9.9 8.4 - 10.5 mg/dL   GFR 103.22 >60.00 mL/min    Assessment and Plan:   Laura Maldonado was seen today for abdominal pain.  Diagnoses and all orders for this visit:  Left lower quadrant pain -     CT Abdomen Pelvis W Contrast; Future -     CBC with  Differential/Platelet -     Comprehensive metabolic panel  Diverticulitis Comments: Confirmed cotninued diverticulitis. No peforation. Patient c/o itching with Augmentin last time, but would like to continue with it. Offered Avelow, but she declined due to fibromyalgia. Offer GI evaluation, but she declined at this time.    . Reviewed expectations re: course of current medical issues. . Discussed  self-management of symptoms. . Outlined signs and symptoms indicating need for more acute intervention. . Patient verbalized understanding and all questions were answered. Marland Kitchen Health Maintenance issues including appropriate healthy diet, exercise, and smoking avoidance were discussed with patient. . See orders for this visit as documented in the electronic medical record. . Patient received an After Visit Summary.   Briscoe Deutscher, DO [place], [facility] 02/14/2018

## 2018-02-08 NOTE — Progress Notes (Signed)
Start Avelox 400 mg daily x 7 days.

## 2018-02-09 ENCOUNTER — Other Ambulatory Visit: Payer: Self-pay | Admitting: Surgical

## 2018-02-09 DIAGNOSIS — K5792 Diverticulitis of intestine, part unspecified, without perforation or abscess without bleeding: Secondary | ICD-10-CM

## 2018-02-09 MED ORDER — AMOXICILLIN-POT CLAVULANATE 875-125 MG PO TABS: 1.0000 | ORAL_TABLET | Freq: Two times a day (BID) | ORAL | 0 refills | Status: DC

## 2018-02-09 NOTE — Telephone Encounter (Signed)
Per Dr. Dala Dock to send in the Augmentin. I have spoke with the patient and she wants to try the Augmentin again. She will watch for side effects. Patient is wanting to hold off on GI referral at this time.

## 2018-02-11 ENCOUNTER — Encounter: Payer: Self-pay | Admitting: Family Medicine

## 2018-02-11 ENCOUNTER — Ambulatory Visit: Payer: 59

## 2018-02-21 ENCOUNTER — Other Ambulatory Visit: Payer: Self-pay | Admitting: Family Medicine

## 2018-02-22 NOTE — Telephone Encounter (Signed)
Please advise on refill.

## 2018-02-26 ENCOUNTER — Ambulatory Visit (INDEPENDENT_AMBULATORY_CARE_PROVIDER_SITE_OTHER): Payer: 59 | Admitting: Family Medicine

## 2018-02-26 ENCOUNTER — Encounter: Payer: Self-pay | Admitting: Family Medicine

## 2018-02-26 VITALS — BP 118/74 | HR 97 | Temp 98.6°F | Ht 65.5 in | Wt 222.4 lb

## 2018-02-26 DIAGNOSIS — K5792 Diverticulitis of intestine, part unspecified, without perforation or abscess without bleeding: Secondary | ICD-10-CM

## 2018-02-26 DIAGNOSIS — Z114 Encounter for screening for human immunodeficiency virus [HIV]: Secondary | ICD-10-CM | POA: Diagnosis not present

## 2018-02-26 DIAGNOSIS — E669 Obesity, unspecified: Secondary | ICD-10-CM

## 2018-02-26 DIAGNOSIS — R51 Headache: Secondary | ICD-10-CM

## 2018-02-26 DIAGNOSIS — E7800 Pure hypercholesterolemia, unspecified: Secondary | ICD-10-CM

## 2018-02-26 DIAGNOSIS — R519 Headache, unspecified: Secondary | ICD-10-CM

## 2018-02-26 DIAGNOSIS — Z0000 Encounter for general adult medical examination without abnormal findings: Secondary | ICD-10-CM | POA: Diagnosis not present

## 2018-02-26 DIAGNOSIS — G8929 Other chronic pain: Secondary | ICD-10-CM

## 2018-02-26 LAB — LIPID PANEL
Cholesterol: 217 mg/dL — ABNORMAL HIGH (ref 0–200)
HDL: 67.4 mg/dL (ref >39.00)
LDL Cholesterol: 135 mg/dL — ABNORMAL HIGH (ref 0–99)
NonHDL: 150.04
Total CHOL/HDL Ratio: 3
Triglycerides: 77 mg/dL (ref 0.0–149.0)
VLDL: 15.4 mg/dL (ref 0.0–40.0)

## 2018-02-26 LAB — COMPREHENSIVE METABOLIC PANEL
ALT: 31 U/L (ref 0–35)
AST: 31 U/L (ref 0–37)
Albumin: 4.4 g/dL (ref 3.5–5.2)
Alkaline Phosphatase: 95 U/L (ref 39–117)
BUN: 13 mg/dL (ref 6–23)
CO2: 27 mEq/L (ref 19–32)
Calcium: 9.9 mg/dL (ref 8.4–10.5)
Chloride: 104 mEq/L (ref 96–112)
Creatinine, Ser: 0.72 mg/dL (ref 0.40–1.20)
GFR: 90.08 mL/min (ref >60.00)
Glucose, Bld: 101 mg/dL — ABNORMAL HIGH (ref 70–99)
Potassium: 4.4 mEq/L (ref 3.5–5.1)
Sodium: 139 mEq/L (ref 135–145)
Total Bilirubin: 0.5 mg/dL (ref 0.2–1.2)
Total Protein: 7.4 g/dL (ref 6.0–8.3)

## 2018-02-26 MED ORDER — TOPIRAMATE 25 MG PO TABS: 25.0000 mg | ORAL_TABLET | Freq: Two times a day (BID) | ORAL | 6 refills | Status: DC

## 2018-02-26 NOTE — Progress Notes (Signed)
Subjective:    Laura Maldonado is a 52 y.o. female and is here for a comprehensive physical exam.  Health Maintenance Due  Topic Date Due  . HIV Screening  03/22/1981    Current Outpatient Medications:  .  B Complex Vitamins (B COMPLEX 100 PO), Take 1 capsule by mouth daily., Disp: , Rfl:  .  BIOTIN PO, Take 1 capsule by mouth daily., Disp: , Rfl:  .  Calcium Carb-Cholecalciferol (CALCIUM 1000 + D PO), Take 1 tablet by mouth daily., Disp: , Rfl:  .  Digestive Enzyme CAPS, Take by mouth. Every meal, Disp: , Rfl:  .  LORazepam (ATIVAN) 0.5 MG tablet, Take 1 tablet (0.5 mg total) by mouth every 8 (eight) hours., Disp: 30 tablet, Rfl: 0 .  Magnesium 500 MG TABS, Take by mouth., Disp: , Rfl:  .  Probiotic Product (PROBIOTIC MATURE ADULT) CAPS, Take by mouth., Disp: , Rfl:  .  Turmeric 500 MG CAPS, Take by mouth., Disp: , Rfl:  .  topiramate (TOPAMAX) 25 MG tablet, Take 1 tablet (25 mg total) by mouth 2 (two) times daily., Disp: 60 tablet, Rfl: 6  PMHx, SurgHx, SocialHx, Medications, and Allergies were reviewed in the Visit Navigator and updated as appropriate.   Past Medical History:  Diagnosis Date  . Allergy   . Anxiety   . Cancer ([place])    melanoma  . Chicken pox   . Diverticulitis   . Frequent headaches   . GERD (gastroesophageal reflux disease)   . Heart murmur    past hx detected by one MD  . Migraines   . Multiple sclerosis ([place]) 05/29/2016   [facility] q 6 months.  . Neuromuscular disorder ([place])    MS  . Obesity (BMI 30-39.9) 01/15/2017  . Pure hypercholesterolemia 05/29/2016  . Vitamin D deficiency 05/29/2016     Past Surgical History:  Procedure Laterality Date  . ABDOMINAL HYSTERECTOMY    . BREAST LUMPECTOMY Left 2007  . CESAREAN SECTION    . CHOLECYSTECTOMY    . COLONOSCOPY    . MELANOMA EXCISION  1986  . MOLE REMOVAL    . UPPER GASTROINTESTINAL ENDOSCOPY       Family History  Problem Relation Age of Onset  . Hypertension Mother   . Heart disease  Father   . Hypertension Maternal Grandmother   . Hypertension Maternal Grandfather   . Hypertension Paternal Grandmother   . Hypertension Paternal Grandfather   . Colon cancer Neg Hx   . Colon polyps Neg Hx   . Esophageal cancer Neg Hx   . Rectal cancer Neg Hx   . Stomach cancer Neg Hx     Social History   Tobacco Use  . Smoking status: Never Smoker  . Smokeless tobacco: Never Used  Substance Use Topics  . Alcohol use: Yes    Comment: rare- maybe 5 x a year   . Drug use: No    Review of Systems:   Pertinent items are noted in the HPI. Otherwise, ROS is negative.  Objective:   BP 118/74   Pulse 97   Temp 98.6 F (37 C) (Oral)   Ht 5' 5.5" (1.664 m)   Wt 222 lb 6.4 oz (100.9 kg)   SpO2 97%   BMI 36.45 kg/m   General appearance: alert, cooperative and appears stated age. Head: normocephalic, without obvious abnormality, atraumatic. Neck: no adenopathy, supple, symmetrical, trachea midline; thyroid not enlarged, symmetric, no tenderness/mass/nodules. Lungs: clear to auscultation bilaterally. Heart: regular rate  and rhythm Abdomen: soft, non-tender; no masses,  no organomegaly. Extremities: extremities normal, atraumatic, no cyanosis or edema. Skin: skin color, texture, turgor normal, no rashes or lesions. Lymph: cervical, supraclavicular, and axillary nodes normal; no abnormal inguinal nodes palpated. Neurologic: grossly normal.                                      Assessment/Plan:   Jadaya was seen today for annual exam.  Diagnoses and all orders for this visit:  Routine physical examination  Pure hypercholesterolemia -     Comprehensive metabolic panel -     Lipid panel  Obesity (BMI 30-39.9)  Screening for HIV (human immunodeficiency virus) -     HIV antibody  Chronic nonintractable headache, unspecified headache type -     topiramate (TOPAMAX) 25 MG tablet; Take 1 tablet (25 mg total) by mouth 2 (two) times daily.  Hx of recurrent  diverticulitis Comments: Fearlfull of antibiotics so didn;t take any after her last CT. Feels better. Discussed danger of potential perf if she declines all Abx treatment in the future. Will ask ID to help with discussion and rec for future Abx. . Orders: -     Ambulatory referral to Infectious Disease    Patient Counseling: [x]    Nutrition: Stressed importance of moderation in sodium/caffeine intake, saturated fat and cholesterol, caloric balance, sufficient intake of fresh fruits, vegetables, fiber, calcium, iron, and 1 mg of folate supplement per day (for females capable of pregnancy).  [x]    Stressed the importance of regular exercise.   [x]    Substance Abuse: Discussed cessation/primary prevention of tobacco, alcohol, or other drug use; driving or other dangerous activities under the influence; availability of treatment for abuse.   [x]    Injury prevention: Discussed safety belts, safety helmets, smoke detector, smoking near bedding or upholstery.   [x]    Sexuality: Discussed sexually transmitted diseases, partner selection, use of condoms, avoidance of unintended pregnancy  and contraceptive alternatives.  [x]    Dental health: Discussed importance of regular tooth brushing, flossing, and dental visits.  [x]    Health maintenance and immunizations reviewed. Please refer to Health maintenance section.   Briscoe Deutscher, DO [place]

## 2018-02-27 LAB — HIV ANTIBODY (ROUTINE TESTING W REFLEX): HIV 1&2 Ab, 4th Generation: NONREACTIVE

## 2018-03-01 ENCOUNTER — Ambulatory Visit
Admission: RE | Admit: 2018-03-01 | Discharge: 2018-03-01 | Disposition: A | Discharge status: Home / Self Care | Payer: 59 | Source: Ambulatory Visit | Attending: Family Medicine | Admitting: Family Medicine

## 2018-03-01 DIAGNOSIS — Z1231 Encounter for screening mammogram for malignant neoplasm of breast: Secondary | ICD-10-CM | POA: Diagnosis not present

## 2018-03-01 DIAGNOSIS — Z139 Encounter for screening, unspecified: Secondary | ICD-10-CM

## 2018-03-04 DIAGNOSIS — G35 Multiple sclerosis: Secondary | ICD-10-CM | POA: Diagnosis not present

## 2018-03-04 DIAGNOSIS — G43709 Chronic migraine without aura, not intractable, without status migrainosus: Secondary | ICD-10-CM | POA: Diagnosis not present

## 2018-04-09 ENCOUNTER — Other Ambulatory Visit: Payer: Self-pay | Admitting: Family Medicine

## 2018-04-09 DIAGNOSIS — G35 Multiple sclerosis: Secondary | ICD-10-CM

## 2018-04-09 MED ORDER — LORAZEPAM 0.5 MG PO TABS: 0.5000 mg | ORAL_TABLET | Freq: Three times a day (TID) | ORAL | 0 refills | Status: DC

## 2018-04-09 NOTE — Telephone Encounter (Signed)
Ok to fill?  07-30-17 #30 no rf Last o/v 02/26/18 F/u 06/02/2018

## 2018-04-12 ENCOUNTER — Encounter: Payer: Self-pay | Admitting: Family Medicine

## 2018-04-13 ENCOUNTER — Other Ambulatory Visit: Payer: Self-pay

## 2018-04-13 DIAGNOSIS — G35 Multiple sclerosis: Secondary | ICD-10-CM

## 2018-04-13 MED ORDER — LORAZEPAM 0.5 MG PO TABS: 0.5000 mg | ORAL_TABLET | Freq: Three times a day (TID) | ORAL | 0 refills | Status: DC

## 2018-05-31 NOTE — Progress Notes (Signed)
Laura Maldonado is a 52 y.o. female is here for follow up.  History of Present Illness:   Laura Maldonado CMA acting as scribe for Dr. [name].  HPI: Patient comes in today for a follow up for her MS. Patient also states that she was walking through a parking lot on Friday where she stumbled. Patient did not fall on the ground. She did fall against the car. Patient denies hitting her head. She is having some pain on the right side of her body.   Health Maintenance Due  Topic Date Due  . INFLUENZA VACCINE  03/18/2018   Depression screen [facility] 2/9 02/26/2018 05/28/2017 05/28/2017  Decreased Interest 1 1 1   Down, Depressed, Hopeless 1 - -  PHQ - 2 Score 2 1 1   Altered sleeping 0 0 -  Tired, decreased energy 1 2 -  Change in appetite 0 0 -  Feeling bad or failure about yourself  0 0 -  Trouble concentrating 0 1 -  Moving slowly or fidgety/restless 0 0 -  Suicidal thoughts 0 0 -  PHQ-9 Score 3 4 -  Difficult doing work/chores Somewhat difficult - -   PMHx, SurgHx, SocialHx, FamHx, Medications, and Allergies were reviewed in the Visit Navigator and updated as appropriate.   Patient Active Problem List   Diagnosis Date Noted  . Chronic nonintractable headache 03/08/2017  . Moderate episode of recurrent major depressive disorder ([place]) 03/08/2017  . Obesity (BMI 30-39.9) 01/15/2017  . Vitamin D deficiency 05/29/2016  . Pure hypercholesterolemia 05/29/2016  . Multiple sclerosis ([place]) 05/29/2016   Social History   Tobacco Use  . Smoking status: Never Smoker  . Smokeless tobacco: Never Used  Substance Use Topics  . Alcohol use: Yes    Comment: rare- maybe 5 x a year   . Drug use: No   Current Medications and Allergies:   .  B Complex Vitamins (B COMPLEX 100 PO), Take 1 capsule by mouth daily., Disp: , Rfl:  .  BIOTIN PO, Take 1 capsule by mouth daily., Disp: , Rfl:  .  Calcium Carb-Cholecalciferol (CALCIUM 1000 + D PO), Take 1 tablet by mouth daily., Disp: , Rfl:  .  Digestive  Enzyme CAPS, Take by mouth. Every meal, Disp: , Rfl:  .  LORazepam (ATIVAN) 0.5 MG tablet, Take 1 tablet (0.5 mg total) by mouth every 8 (eight) hours., Disp: 30 tablet, Rfl: 0 .  Magnesium 500 MG TABS, Take by mouth., Disp: , Rfl:  .  Probiotic Product (PROBIOTIC MATURE ADULT) CAPS, Take by mouth., Disp: , Rfl:  .  topiramate (TOPAMAX) 25 MG tablet, Take 1 tablet (25 mg total) by mouth 2 (two) times daily., Disp: 60 tablet, Rfl: 6 .  Turmeric 500 MG CAPS, Take by mouth., Disp: , Rfl:    Allergies  Allergen Reactions  . Bee Venom Anaphylaxis, Itching and Swelling  . Shellfish Allergy Shortness Of Breath  . Interferons Other (See Comments)    Chest pains, burning trouble breathing itching  . Acetaminophen Other (See Comments)    Elevates live enzymes  . Codeine Nausea Only  . Flagyl [Metronidazole] Other (See Comments)    Migraines   . Gabapentin Hives  . Gluten Meal Diarrhea and Other (See Comments)    Stomach pain   . Lactose     Other reaction(s): Unknown  . Lamotrigine Hives  . Latex   . Oseltamivir Other (See Comments)   Review of Systems   Pertinent items are noted in the HPI. Otherwise,  ROS is negative.  Vitals:   Vitals:   06/01/18 1601  BP: 124/86  Pulse: 78  Temp: 98.1 F (36.7 C)  TempSrc: Oral  SpO2: 98%  Weight: 224 lb 9.6 oz (101.9 kg)  Height: 5' 5.5" (1.664 m)     Body mass index is 36.81 kg/m.  Physical Exam:   Physical Exam  Constitutional: She appears well-nourished.  HENT:  Head: Normocephalic and atraumatic.  Eyes: Pupils are equal, round, and reactive to light. EOM are normal.  Neck: Normal range of motion. Neck supple.  Cardiovascular: Normal rate, regular rhythm, normal heart sounds and intact distal pulses.  Pulmonary/Chest: Effort normal.  Abdominal: Soft.  Musculoskeletal:       Right knee: Tenderness found. Medial joint line and MCL tenderness noted.       Right ankle: She exhibits normal range of motion, no ecchymosis and no  deformity. Tenderness. Lateral malleolus tenderness found.       Lumbar back: She exhibits spasm.  Skin: Skin is warm.  Psychiatric: She has a normal mood and affect. Her behavior is normal.  Nursing note and vitals reviewed.  Results for orders placed or performed in visit on 02/26/18  Comprehensive metabolic panel  Result Value Ref Range   Sodium 139 135 - 145 mEq/L   Potassium 4.4 3.5 - 5.1 mEq/L   Chloride 104 96 - 112 mEq/L   CO2 27 19 - 32 mEq/L   Glucose, Bld 101 (H) 70 - 99 mg/dL   BUN 13 6 - 23 mg/dL   Creatinine, Ser 0.72 0.40 - 1.20 mg/dL   Total Bilirubin 0.5 0.2 - 1.2 mg/dL   Alkaline Phosphatase 95 39 - 117 U/L   AST 31 0 - 37 U/L   ALT 31 0 - 35 U/L   Total Protein 7.4 6.0 - 8.3 g/dL   Albumin 4.4 3.5 - 5.2 g/dL   Calcium 9.9 8.4 - 10.5 mg/dL   GFR 90.08 >60.00 mL/min  Lipid panel  Result Value Ref Range   Cholesterol 217 (H) 0 - 200 mg/dL   Triglycerides 77.0 0.0 - 149.0 mg/dL   HDL 67.40 >39.00 mg/dL   VLDL 15.4 0.0 - 40.0 mg/dL   LDL Cholesterol 135 (H) 0 - 99 mg/dL   Total CHOL/HDL Ratio 3    NonHDL 150.04   HIV antibody  Result Value Ref Range   HIV 1&2 Ab, 4th Generation NON-REACTIVE NON-REACTI   Assessment and Plan:   [name] was seen today for follow-up.  Diagnoses and all orders for this visit:  Fall, initial encounter Strain of lumbar region, initial encounter Sprain of left ankle, unspecified ligament, initial encounter Sprain of medial collateral ligament of left knee, initial encounter -     baclofen (LIORESAL) 10 MG tablet; Take 1 tablet (10 mg total) by mouth 3 (three) times daily. -     meloxicam (MOBIC) 15 MG tablet; Take 1 tablet (15 mg total) by mouth daily.  Multiple sclerosis ([place])  . Reviewed expectations re: course of current medical issues. . Discussed self-management of symptoms. . Outlined signs and symptoms indicating need for more acute intervention. . Patient verbalized understanding and all questions were  answered. Marland Kitchen Health Maintenance issues including appropriate healthy diet, exercise, and smoking avoidance were discussed with patient. . See orders for this visit as documented in the electronic medical record. . Patient received an After Visit Summary.  CMA served as Education administrator during this visit. History, Physical, and Plan performed by medical provider. The above documentation  has been reviewed and is accurate and complete. Briscoe Deutscher, D.O.  Briscoe Deutscher, DO [place], [facility] 06/02/2018

## 2018-06-01 ENCOUNTER — Encounter: Payer: Self-pay | Admitting: Family Medicine

## 2018-06-01 ENCOUNTER — Ambulatory Visit (INDEPENDENT_AMBULATORY_CARE_PROVIDER_SITE_OTHER): Payer: 59 | Admitting: Family Medicine

## 2018-06-01 VITALS — BP 124/86 | HR 78 | Temp 98.1°F | Ht 65.5 in | Wt 224.6 lb

## 2018-06-01 DIAGNOSIS — S39012A Strain of muscle, fascia and tendon of lower back, initial encounter: Secondary | ICD-10-CM

## 2018-06-01 DIAGNOSIS — G35 Multiple sclerosis: Secondary | ICD-10-CM

## 2018-06-01 DIAGNOSIS — S83412A Sprain of medial collateral ligament of left knee, initial encounter: Secondary | ICD-10-CM

## 2018-06-01 DIAGNOSIS — W19XXXA Unspecified fall, initial encounter: Secondary | ICD-10-CM

## 2018-06-01 DIAGNOSIS — S93402A Sprain of unspecified ligament of left ankle, initial encounter: Secondary | ICD-10-CM | POA: Diagnosis not present

## 2018-06-01 MED ORDER — BACLOFEN 10 MG PO TABS: 10.0000 mg | ORAL_TABLET | Freq: Three times a day (TID) | ORAL | 0 refills | Status: DC

## 2018-06-01 MED ORDER — MELOXICAM 15 MG PO TABS: 15.0000 mg | ORAL_TABLET | Freq: Every day | ORAL | 0 refills | Status: DC

## 2018-06-02 ENCOUNTER — Ambulatory Visit: Payer: 59 | Admitting: Family Medicine

## 2018-06-02 ENCOUNTER — Encounter: Payer: Self-pay | Admitting: Family Medicine

## 2018-06-15 ENCOUNTER — Encounter: Payer: Self-pay | Admitting: Family Medicine

## 2018-06-15 DIAGNOSIS — R05 Cough: Secondary | ICD-10-CM | POA: Diagnosis not present

## 2018-06-28 NOTE — Progress Notes (Signed)
Laura Maldonado is a 52 y.o. female here for an acute visit.  History of Present Illness:   Laura Maldonado, CMA acting as scribe for Dr. Briscoe Deutscher.   Cough  This is a new problem. The current episode started 1 to 4 weeks ago. The problem has been gradually improving. The cough is non-productive. Associated symptoms include chills, headaches, nasal congestion and a sore throat. Pertinent negatives include no ear congestion, ear pain or fever. Nothing aggravates the symptoms. She has tried steroid inhaler and prescription cough suppressant for the symptoms. The treatment provided mild relief.   PMHx, SurgHx, SocialHx, Medications, and Allergies were reviewed in the Visit Navigator and updated as appropriate.  Current Medications:   .  B Complex Vitamins (B COMPLEX 100 PO), Take 1 capsule by mouth daily., Disp: , Rfl:  .  baclofen (LIORESAL) 10 MG tablet, Take 1 tablet (10 mg total) by mouth 3 (three) times daily., Disp: 30 each, Rfl: 0 .  BIOTIN PO, Take 1 capsule by mouth daily., Disp: , Rfl:  .  Calcium Carb-Cholecalciferol (CALCIUM 1000 + D PO), Take 1 tablet by mouth daily., Disp: , Rfl:  .  Digestive Enzyme CAPS, Take by mouth. Every meal, Disp: , Rfl:  .  LORazepam (ATIVAN) 0.5 MG tablet, Take 1 tablet (0.5 mg total) by mouth every 8 (eight) hours., Disp: 30 tablet, Rfl: 0 .  Magnesium 500 MG TABS, Take by mouth., Disp: , Rfl:  .  meloxicam (MOBIC) 15 MG tablet, Take 1 tablet (15 mg total) by mouth daily., Disp: 30 tablet, Rfl: 0 .  Probiotic Product (PROBIOTIC MATURE ADULT) CAPS, Take by mouth., Disp: , Rfl:  .  topiramate (TOPAMAX) 25 MG tablet, Take 1 tablet (25 mg total) by mouth 2 (two) times daily., Disp: 60 tablet, Rfl: 6 .  Turmeric 500 MG CAPS, Take by mouth., Disp: , Rfl:    Allergies  Allergen Reactions  . Bee Venom Anaphylaxis, Itching and Swelling  . Shellfish Allergy Shortness Of Breath  . Interferons Other (See Comments)    Chest pains, burning trouble  breathing itching  . Acetaminophen Other (See Comments)    Elevates live enzymes  . Codeine Nausea Only  . Flagyl [Metronidazole] Other (See Comments)    Migraines   . Gabapentin Hives  . Gluten Meal Diarrhea and Other (See Comments)    Stomach pain   . Lactose     Other reaction(s): Unknown  . Lamotrigine Hives  . Latex   . Oseltamivir Other (See Comments)   Review of Systems:   Pertinent items are noted in the HPI. Otherwise, ROS is negative.  Vitals:   Vitals:   06/30/18 1055  BP: 120/64  Pulse: 80  Temp: 97.9 F (36.6 C)  TempSrc: Oral  SpO2: 98%  Weight: 225 lb (102.1 kg)  Height: 5' 5.5" (1.664 m)     Body mass index is 36.87 kg/m.  Physical Exam:   Physical Exam  Constitutional: She appears well-nourished.  HENT:  Head: Normocephalic and atraumatic.  Eyes: Pupils are equal, round, and reactive to light. EOM are normal.  Neck: Normal range of motion. Neck supple.  Cardiovascular: Normal rate, regular rhythm, normal heart sounds and intact distal pulses.  Pulmonary/Chest: Effort normal.  Abdominal: Soft.  Skin: Skin is warm.  Psychiatric: She has a normal mood and affect. Her behavior is normal.  Nursing note and vitals reviewed.  Assessment and Plan:   Laura Maldonado was seen today for cough.  Diagnoses and all orders  for this visit:  Bronchitis, acute, with bronchospasm Comments: Improved with albuterol. Discussed red flags and symptomatic care going forward. Safety net Rx below. Orders: -     albuterol (PROVENTIL HFA;VENTOLIN HFA) 108 (90 Base) MCG/ACT inhaler; Inhale 2 puffs into the lungs every 6 (six) hours as needed for wheezing or shortness of breath. -     azithromycin (ZITHROMAX) 250 MG tablet; 2 tabs on day one and one a day after -     beclomethasone (QVAR REDIHALER) 80 MCG/ACT inhaler; Inhale 2 puffs into the lungs 2 (two) times daily. -     predniSONE (DELTASONE) 5 MG tablet; Take 1 tablet (5 mg total) by mouth daily with breakfast.  6-5-4-3-2-1  . Reviewed expectations re: course of current medical issues. . Discussed self-management of symptoms. . Outlined signs and symptoms indicating need for more acute intervention. . Patient verbalized understanding and all questions were answered. Marland Kitchen Health Maintenance issues including appropriate healthy diet, exercise, and smoking avoidance were discussed with patient. . See orders for this visit as documented in the electronic medical record. . Patient received an After Visit Summary.  CMA served as Education administrator during this visit. History, Physical, and Plan performed by medical provider. The above documentation has been reviewed and is accurate and complete. Briscoe Deutscher, D.O.  Briscoe Deutscher, DO [place], [facility] 07/01/2018

## 2018-06-30 ENCOUNTER — Ambulatory Visit (INDEPENDENT_AMBULATORY_CARE_PROVIDER_SITE_OTHER): Payer: 59 | Admitting: Family Medicine

## 2018-06-30 ENCOUNTER — Encounter: Payer: Self-pay | Admitting: Family Medicine

## 2018-06-30 VITALS — BP 120/64 | HR 80 | Temp 97.9°F | Ht 65.5 in | Wt 225.0 lb

## 2018-06-30 DIAGNOSIS — J209 Acute bronchitis, unspecified: Secondary | ICD-10-CM

## 2018-06-30 MED ORDER — ALBUTEROL SULFATE HFA 108 (90 BASE) MCG/ACT IN AERS: 2.0000 | INHALATION_SPRAY | Freq: Four times a day (QID) | RESPIRATORY_TRACT | 0 refills | Status: DC | PRN

## 2018-06-30 MED ORDER — BECLOMETHASONE DIPROP HFA 80 MCG/ACT IN AERB: 2.0000 | INHALATION_SPRAY | Freq: Two times a day (BID) | RESPIRATORY_TRACT | 1 refills | Status: DC

## 2018-06-30 MED ORDER — PREDNISONE 5 MG PO TABS: 5.0000 mg | ORAL_TABLET | Freq: Every day | ORAL | 0 refills | Status: DC

## 2018-06-30 MED ORDER — AZITHROMYCIN 250 MG PO TABS: ORAL_TABLET | ORAL | 0 refills | Status: DC

## 2018-07-01 ENCOUNTER — Encounter: Payer: Self-pay | Admitting: Family Medicine

## 2018-07-26 ENCOUNTER — Encounter: Payer: Self-pay | Admitting: Family Medicine

## 2018-07-26 ENCOUNTER — Telehealth: Payer: Self-pay | Admitting: *Deleted

## 2018-07-26 ENCOUNTER — Ambulatory Visit: Payer: 59 | Admitting: Family Medicine

## 2018-07-26 ENCOUNTER — Ambulatory Visit (INDEPENDENT_AMBULATORY_CARE_PROVIDER_SITE_OTHER): Payer: 59 | Admitting: Family Medicine

## 2018-07-26 VITALS — BP 140/76 | HR 97 | Temp 98.3°F | Ht 65.5 in | Wt 225.0 lb

## 2018-07-26 DIAGNOSIS — M79605 Pain in left leg: Secondary | ICD-10-CM | POA: Diagnosis not present

## 2018-07-26 DIAGNOSIS — G35 Multiple sclerosis: Secondary | ICD-10-CM

## 2018-07-26 MED ORDER — METHYLPREDNISOLONE 4 MG PO TBPK: ORAL_TABLET | ORAL | 0 refills | Status: DC

## 2018-07-26 MED ORDER — KETOROLAC TROMETHAMINE 10 MG PO TABS: 10.0000 mg | ORAL_TABLET | Freq: Four times a day (QID) | ORAL | 0 refills | Status: DC | PRN

## 2018-07-26 NOTE — Progress Notes (Signed)
Laura Maldonado is a 52 y.o. female here for an acute visit.  History of Present Illness:   Laura Maldonado, CMA acting as scribe for Dr. Briscoe Deutscher.   HPI: Patient started having dull neck pain on Thursday. Sunday started sharp stabbing ripping pian that in left leg that started on Sunday. Pain increased when siting but worse with laying. Pain standing is a 7/10 but when position changes can get to 9/10. She has tried baclofen with no help. Now is taking two Advil every 4 hours and a 0.5 mg ativan at 2 am and an hour ago. She is having weakness and using a cane today. She has not noticed any increased weakness from start.   No shingles concerns - no rash, itch. Has MS. Neurologist is at [facility]. She doesn't feel that she can make it to the site - too painful to ride in car.  PMHx, SurgHx, SocialHx, Medications, and Allergies were reviewed in the Visit Navigator and updated as appropriate.  Current Medications:   .  albuterol (PROVENTIL HFA;VENTOLIN HFA) 108 (90 Base) MCG/ACT inhaler, INHALE 1-2 PUFFS EVERY 4-6 HOURS AS NEEDED FOR WHEEZING, Disp: , Rfl: 0 .  albuterol (PROVENTIL HFA;VENTOLIN HFA) 108 (90 Base) MCG/ACT inhaler, Inhale 2 puffs into the lungs every 6 (six) hours as needed for wheezing or shortness of breath., Disp: 1 Inhaler, Rfl: 0 .  B Complex Vitamins (B COMPLEX 100 PO), Take 1 capsule by mouth daily., Disp: , Rfl:  .  baclofen (LIORESAL) 10 MG tablet, Take 1 tablet (10 mg total) by mouth 3 (three) times daily., Disp: 30 each, Rfl: 0 .  beclomethasone (QVAR REDIHALER) 80 MCG/ACT inhaler, Inhale 2 puffs into the lungs 2 (two) times daily., Disp: 1 Inhaler, Rfl: 1 .  benzonatate (TESSALON) 100 MG capsule, TAKE 1 CAPSULE BY MOUTH THREE TIMES A DAY AS NEEDED *DO NOT BREAK CHEW, DISSOLVE, CUT, OR CRUSH*, Disp: , Rfl: 0 .  BIOTIN PO, Take 1 capsule by mouth daily., Disp: , Rfl:  .  Calcium Carb-Cholecalciferol (CALCIUM 1000 + D PO), Take 1 tablet by mouth daily., Disp: , Rfl:  .   Digestive Enzyme CAPS, Take by mouth. Every meal, Disp: , Rfl:  .  LORazepam (ATIVAN) 0.5 MG tablet, Take 1 tablet (0.5 mg total) by mouth every 8 (eight) hours., Disp: 30 tablet, Rfl: 0 .  Magnesium 500 MG TABS, Take by mouth., Disp: , Rfl:  .  Probiotic Product (PROBIOTIC MATURE ADULT) CAPS, Take by mouth., Disp: , Rfl:  .  topiramate (TOPAMAX) 25 MG tablet, Take 1 tablet (25 mg total) by mouth 2 (two) times daily., Disp: 60 tablet, Rfl: 6 .  Turmeric 500 MG CAPS, Take by mouth., Disp: , Rfl:    Allergies  Allergen Reactions  . Bee Venom Anaphylaxis, Itching and Swelling  . Shellfish Allergy Shortness Of Breath  . Interferons Other (See Comments)    Chest pains, burning trouble breathing itching  . Acetaminophen Other (See Comments)    Elevates live enzymes  . Codeine Nausea Only  . Flagyl [Metronidazole] Other (See Comments)    Migraines   . Gabapentin Hives  . Gluten Meal Diarrhea and Other (See Comments)    Stomach pain   . Lactose     Other reaction(s): Unknown  . Lamotrigine Hives  . Latex   . Oseltamivir Other (See Comments)   Review of Systems:   Pertinent items are noted in the HPI. Otherwise, ROS is negative.  Vitals:   Vitals:  07/26/18 1559  BP: 140/76  Pulse: 97  Temp: 98.3 F (36.8 C)  TempSrc: Oral  SpO2: 97%  Weight: 225 lb (102.1 kg)  Height: 5' 5.5" (1.664 m)     Body mass index is 36.87 kg/m.  Physical Exam:   Physical Exam Vitals signs and nursing note reviewed.  HENT:     Head: Normocephalic and atraumatic.  Eyes:     Pupils: Pupils are equal, round, and reactive to light.  Neck:     Musculoskeletal: Normal range of motion and neck supple.  Cardiovascular:     Rate and Rhythm: Normal rate and regular rhythm.     Heart sounds: Normal heart sounds.  Pulmonary:     Effort: Pulmonary effort is normal.  Abdominal:     Palpations: Abdomen is soft.  Musculoskeletal:     Lumbar back: She exhibits decreased range of motion, tenderness and  spasm.  Skin:    General: Skin is warm.  Neurological:     Cranial Nerves: Cranial nerves are intact.     Sensory: Sensation is intact.     Motor: Motor function is intact.     Gait: Gait abnormal.     Comments: Abnormal gait due to pain.  Psychiatric:        Behavior: Behavior normal.       Results for orders placed or performed in visit on 07/26/18  CBC with Differential/Platelet  Result Value Ref Range   WBC 10.8 (H) 4.0 - 10.5 K/uL   RBC 4.31 3.87 - 5.11 Mil/uL   Hemoglobin 13.3 12.0 - 15.0 g/dL   HCT 39.6 36.0 - 46.0 %   MCV 92.0 78.0 - 100.0 fl   MCHC 33.6 30.0 - 36.0 g/dL   RDW 13.7 11.5 - 15.5 %   Platelets 291.0 150.0 - 400.0 K/uL   Neutrophils Relative % 72.1 43.0 - 77.0 %   Lymphocytes Relative 19.7 12.0 - 46.0 %   Monocytes Relative 6.8 3.0 - 12.0 %   Eosinophils Relative 0.7 0.0 - 5.0 %   Basophils Relative 0.7 0.0 - 3.0 %   Neutro Abs 7.8 (H) 1.4 - 7.7 K/uL   Lymphs Abs 2.1 0.7 - 4.0 K/uL   Monocytes Absolute 0.7 0.1 - 1.0 K/uL   Eosinophils Absolute 0.1 0.0 - 0.7 K/uL   Basophils Absolute 0.1 0.0 - 0.1 K/uL  Comprehensive metabolic panel  Result Value Ref Range   Sodium 137 135 - 145 mEq/L   Potassium 3.9 3.5 - 5.1 mEq/L   Chloride 104 96 - 112 mEq/L   CO2 25 19 - 32 mEq/L   Glucose, Bld 101 (H) 70 - 99 mg/dL   BUN 16 6 - 23 mg/dL   Creatinine, Ser 0.69 0.40 - 1.20 mg/dL   Total Bilirubin 0.4 0.2 - 1.2 mg/dL   Alkaline Phosphatase 83 39 - 117 U/L   AST 25 0 - 37 U/L   ALT 22 0 - 35 U/L   Total Protein 7.8 6.0 - 8.3 g/dL   Albumin 4.4 3.5 - 5.2 g/dL   Calcium 9.9 8.4 - 10.5 mg/dL   GFR 94.47 >60.00 mL/min  CK  Result Value Ref Range   Total CK 108 7 - 177 U/L    Assessment and Plan:   Laura Maldonado was seen today for leg pain.  Diagnoses and all orders for this visit:  Pain of left lower extremity Comments: Consistent wit MS flare. Has appointment this week with Neurology. Consider MRI if not improving. Red  falgs reviewed.  Orders: -      ketorolac (TORADOL) 10 MG tablet; Take 1 tablet (10 mg total) by mouth every 6 (six) hours as needed. -     methylPREDNISolone (MEDROL DOSEPAK) 4 MG TBPK tablet; Per pack instructions. -     CBC with Differential/Platelet -     Comprehensive metabolic panel -     CK  Multiple sclerosis ([place])   . Reviewed expectations re: course of current medical issues. . Discussed self-management of symptoms. . Outlined signs and symptoms indicating need for more acute intervention. . Patient verbalized understanding and all questions were answered. Marland Kitchen Health Maintenance issues including appropriate healthy diet, exercise, and smoking avoidance were discussed with patient. . See orders for this visit as documented in the electronic medical record. . Patient received an After Visit Summary.  CMA served as Education administrator during this visit. History, Physical, and Plan performed by medical provider. The above documentation has been reviewed and is accurate and complete. Briscoe Deutscher, D.O.  Briscoe Deutscher, DO [place], [facility] 08/04/2018

## 2018-07-26 NOTE — Telephone Encounter (Signed)
See note. Please advise Hailey on scheduling

## 2018-07-26 NOTE — Telephone Encounter (Signed)
Pt being worked in today

## 2018-07-26 NOTE — Telephone Encounter (Signed)
Copied from [place] [phone]. Topic: Appointment Scheduling - Scheduling Inquiry for Clinic >> Jul 26, 2018  7:08 AM Rayann Heman wrote: Reason for CRM: pt called and stated that she is in a lot of pain. Pt states that she is having left leg pain causing immobility due to MS . Pt would like to be worked in if possible. [phone]

## 2018-07-26 NOTE — Telephone Encounter (Signed)
Patient app made with [name] today

## 2018-07-27 LAB — CK: Total CK: 108 U/L (ref 7–177)

## 2018-07-27 LAB — CBC WITH DIFFERENTIAL/PLATELET
Basophils Absolute: 0.1 10*3/uL (ref 0.0–0.1)
Basophils Relative: 0.7 % (ref 0.0–3.0)
Eosinophils Absolute: 0.1 10*3/uL (ref 0.0–0.7)
Eosinophils Relative: 0.7 % (ref 0.0–5.0)
HCT: 39.6 % (ref 36.0–46.0)
Hemoglobin: 13.3 g/dL (ref 12.0–15.0)
Lymphocytes Relative: 19.7 % (ref 12.0–46.0)
Lymphs Abs: 2.1 10*3/uL (ref 0.7–4.0)
MCHC: 33.6 g/dL (ref 30.0–36.0)
MCV: 92 fl (ref 78.0–100.0)
Monocytes Absolute: 0.7 10*3/uL (ref 0.1–1.0)
Monocytes Relative: 6.8 % (ref 3.0–12.0)
Neutro Abs: 7.8 10*3/uL — ABNORMAL HIGH (ref 1.4–7.7)
Neutrophils Relative %: 72.1 % (ref 43.0–77.0)
Platelets: 291 10*3/uL (ref 150.0–400.0)
RBC: 4.31 Mil/uL (ref 3.87–5.11)
RDW: 13.7 % (ref 11.5–15.5)
WBC: 10.8 10*3/uL — ABNORMAL HIGH (ref 4.0–10.5)

## 2018-07-27 LAB — COMPREHENSIVE METABOLIC PANEL
ALT: 22 U/L (ref 0–35)
AST: 25 U/L (ref 0–37)
Albumin: 4.4 g/dL (ref 3.5–5.2)
Alkaline Phosphatase: 83 U/L (ref 39–117)
BUN: 16 mg/dL (ref 6–23)
CO2: 25 mEq/L (ref 19–32)
Calcium: 9.9 mg/dL (ref 8.4–10.5)
Chloride: 104 mEq/L (ref 96–112)
Creatinine, Ser: 0.69 mg/dL (ref 0.40–1.20)
GFR: 94.47 mL/min (ref >60.00)
Glucose, Bld: 101 mg/dL — ABNORMAL HIGH (ref 70–99)
Potassium: 3.9 mEq/L (ref 3.5–5.1)
Sodium: 137 mEq/L (ref 135–145)
Total Bilirubin: 0.4 mg/dL (ref 0.2–1.2)
Total Protein: 7.8 g/dL (ref 6.0–8.3)

## 2018-07-28 ENCOUNTER — Other Ambulatory Visit: Payer: Self-pay | Admitting: Family Medicine

## 2018-07-28 DIAGNOSIS — J209 Acute bronchitis, unspecified: Secondary | ICD-10-CM

## 2018-07-29 ENCOUNTER — Encounter: Payer: Self-pay | Admitting: Family Medicine

## 2018-08-03 ENCOUNTER — Other Ambulatory Visit: Payer: Self-pay | Admitting: Physician Assistant

## 2018-08-03 DIAGNOSIS — M792 Neuralgia and neuritis, unspecified: Secondary | ICD-10-CM

## 2018-08-03 DIAGNOSIS — M62838 Other muscle spasm: Secondary | ICD-10-CM

## 2018-08-03 DIAGNOSIS — G35 Multiple sclerosis: Secondary | ICD-10-CM

## 2018-08-08 NOTE — Progress Notes (Signed)
Laura Maldonado is a 52 y.o. female is here for follow up.  History of Present Illness:   Laura Maldonado, CMA acting as scribe for Dr. Briscoe Maldonado.   HPI: Patient in office for left leg pain and weakness. She was seen in our office for this. She had an appointment with neurology but provider was out of office that day. Her pain today is a 3/10 it is now a dull pain. Has improved a lot from last visit. She has MRI on brain, cervical and lumbar on January 10 at [facility].    Depression screen [facility] 2/9 02/26/2018 05/28/2017 05/28/2017  Decreased Interest 1 1 1   Down, Depressed, Hopeless 1 - -  PHQ - 2 Score 2 1 1   Altered sleeping 0 0 -  Tired, decreased energy 1 2 -  Change in appetite 0 0 -  Feeling bad or failure about yourself  0 0 -  Trouble concentrating 0 1 -  Moving slowly or fidgety/restless 0 0 -  Suicidal thoughts 0 0 -  PHQ-9 Score 3 4 -  Difficult doing work/chores Somewhat difficult - -   PMHx, SurgHx, SocialHx, FamHx, Medications, and Allergies were reviewed in the Visit Navigator and updated as appropriate.   Patient Active Problem List   Diagnosis Date Noted  . Chronic nonintractable headache 03/08/2017  . Moderate episode of recurrent major depressive disorder ([place]) 03/08/2017  . Obesity (BMI 30-39.9) 01/15/2017  . Vitamin D deficiency 05/29/2016  . Pure hypercholesterolemia 05/29/2016  . Multiple sclerosis ([place]) 05/29/2016   Social History   Tobacco Use  . Smoking status: Never Smoker  . Smokeless tobacco: Never Used  Substance Use Topics  . Alcohol use: Yes    Comment: rare- maybe 5 x a year   . Drug use: No   Current Medications and Allergies:   .  albuterol (PROVENTIL HFA;VENTOLIN HFA) 108 (90 Base) MCG/ACT inhaler, INHALE 1-2 PUFFS EVERY 4-6 HOURS AS NEEDED FOR WHEEZING, Disp: , Rfl: 0 .  albuterol (PROVENTIL HFA;VENTOLIN HFA) 108 (90 Base) MCG/ACT inhaler, TAKE 2 PUFFS BY MOUTH EVERY 6 HOURS AS NEEDED FOR WHEEZE OR SHORTNESS OF BREATH, Disp: 6.7  Inhaler, Rfl: 0 .  B Complex Vitamins (B COMPLEX 100 PO), Take 1 capsule by mouth daily., Disp: , Rfl:  .  baclofen (LIORESAL) 10 MG tablet, Take 1 tablet (10 mg total) by mouth 3 (three) times daily., Disp: 30 each, Rfl: 0 .  beclomethasone (QVAR REDIHALER) 80 MCG/ACT inhaler, Inhale 2 puffs into the lungs 2 (two) times daily., Disp: 1 Inhaler, Rfl: 1 .  benzonatate (TESSALON) 100 MG capsule, TAKE 1 CAPSULE BY MOUTH THREE TIMES A DAY AS NEEDED *DO NOT BREAK CHEW, DISSOLVE, CUT, OR CRUSH*, Disp: , Rfl: 0 .  BIOTIN PO, Take 1 capsule by mouth daily., Disp: , Rfl:  .  Calcium Carb-Cholecalciferol (CALCIUM 1000 + D PO), Take 1 tablet by mouth daily., Disp: , Rfl:  .  Digestive Enzyme CAPS, Take by mouth. Every meal, Disp: , Rfl:  .  ketorolac (TORADOL) 10 MG tablet, Take 1 tablet (10 mg total) by mouth every 6 (six) hours as needed., Disp: 20 tablet, Rfl: 0 .  LORazepam (ATIVAN) 0.5 MG tablet, Take 1 tablet (0.5 mg total) by mouth every 8 (eight) hours., Disp: 30 tablet, Rfl: 0 .  Magnesium 500 MG TABS, Take by mouth., Disp: , Rfl:  .  methylPREDNISolone (MEDROL DOSEPAK) 4 MG TBPK tablet, Per pack instructions., Disp: 21 tablet, Rfl: 0 .  Probiotic Product (  PROBIOTIC MATURE ADULT) CAPS, Take by mouth., Disp: , Rfl:  .  topiramate (TOPAMAX) 25 MG tablet, Take 1 tablet (25 mg total) by mouth 2 (two) times daily., Disp: 60 tablet, Rfl: 6 .  Turmeric 500 MG CAPS, Take by mouth., Disp: , Rfl:    Allergies  Allergen Reactions  . Bee Venom Anaphylaxis, Itching and Swelling  . Shellfish Allergy Shortness Of Breath  . Interferons Other (See Comments)    Chest pains, burning trouble breathing itching  . Acetaminophen Other (See Comments)    Elevates live enzymes  . Codeine Nausea Only  . Flagyl [Metronidazole] Other (See Comments)    Migraines   . Gabapentin Hives  . Gluten Meal Diarrhea and Other (See Comments)    Stomach pain   . Lactose     Other reaction(s): Unknown  . Lamotrigine Hives  .  Latex   . Oseltamivir Other (See Comments)   Review of Systems   Pertinent items are noted in the HPI. Otherwise, a complete ROS is negative.  Vitals:   Vitals:   08/09/18 1257  BP: 138/74  Pulse: 77  Temp: 98 F (36.7 C)  TempSrc: Oral  SpO2: 99%  Weight: 222 lb 12.8 oz (101.1 kg)  Height: 5\' 6"  (1.676 m)     Body mass index is 35.96 kg/m.  Physical Exam:   Physical Exam Vitals signs and nursing note reviewed.  HENT:     Head: Normocephalic and atraumatic.  Eyes:     Pupils: Pupils are equal, round, and reactive to light.  Neck:     Musculoskeletal: Normal range of motion and neck supple.  Cardiovascular:     Rate and Rhythm: Normal rate and regular rhythm.     Heart sounds: Normal heart sounds.  Pulmonary:     Effort: Pulmonary effort is normal.  Abdominal:     Palpations: Abdomen is soft.  Musculoskeletal:     Lumbar back: She exhibits decreased range of motion, tenderness and spasm.  Skin:    General: Skin is warm.  Neurological:     Cranial Nerves: Cranial nerves are intact.     Sensory: Sensation is intact.     Motor: Motor function is intact.     Gait: Gait is intact.  Psychiatric:        Behavior: Behavior normal.     Assessment and Plan:   Laura Maldonado was seen today for leg pain. MS flare v simple lumbar strain. She admits to helping her husband move boxes prior to pain. Has upcoming MRIs though current Neurology group. Discussed symptomatic care. Offered local names of Neurology if the drive continues to be an issue for her. Offer PT and [facility].  Diagnoses and all orders for this visit:  Vitamin D deficiency -     VITAMIN D 25 Hydroxy (Vit-D Deficiency, Fractures)  Multiple sclerosis ([place]) -     CBC with Differential/Platelet -     Comprehensive metabolic panel -     Sedimentation rate -     C-reactive protein -     Ambulatory referral to [facility] -     baclofen (LIORESAL) 10 MG tablet; Take 1 tablet (10 mg total) by mouth 3 (three) times  daily.  Difficulty walking -     Ambulatory referral to Home Health  Fear of other medical care -     diazepam (VALIUM) 5 MG tablet; Take 1 tablet (5 mg total) by mouth every 6 (six) hours as needed for anxiety.  Acute  lumbar radiculopathy -     CBC with Differential/Platelet -     Comprehensive metabolic panel -     Sedimentation rate -     C-reactive protein -     baclofen (LIORESAL) 10 MG tablet; Take 1 tablet (10 mg total) by mouth 3 (three) times daily.  . Orders and follow up as documented in [place], reviewed diet, exercise and weight control, cardiovascular risk and specific lipid/LDL goals reviewed, reviewed medications and side effects in detail.  . Reviewed expectations re: course of current medical issues. . Outlined signs and symptoms indicating need for more acute intervention. . Patient verbalized understanding and all questions were answered. . Patient received an After Visit Summary.  CMA served as Education administrator during this visit. History, Physical, and Plan performed by medical provider. The above documentation has been reviewed and is accurate and complete. Laura Maldonado, D.O.  Laura Deutscher, DO [place], [facility] 08/10/2018

## 2018-08-09 ENCOUNTER — Encounter: Payer: Self-pay | Admitting: Family Medicine

## 2018-08-09 ENCOUNTER — Ambulatory Visit (INDEPENDENT_AMBULATORY_CARE_PROVIDER_SITE_OTHER): Payer: 59 | Admitting: Family Medicine

## 2018-08-09 VITALS — BP 138/74 | HR 77 | Temp 98.0°F | Ht 66.0 in | Wt 222.8 lb

## 2018-08-09 DIAGNOSIS — R262 Difficulty in walking, not elsewhere classified: Secondary | ICD-10-CM

## 2018-08-09 DIAGNOSIS — G35 Multiple sclerosis: Secondary | ICD-10-CM | POA: Diagnosis not present

## 2018-08-09 DIAGNOSIS — E559 Vitamin D deficiency, unspecified: Secondary | ICD-10-CM | POA: Diagnosis not present

## 2018-08-09 DIAGNOSIS — F40232 Fear of other medical care: Secondary | ICD-10-CM

## 2018-08-09 DIAGNOSIS — M5416 Radiculopathy, lumbar region: Secondary | ICD-10-CM | POA: Diagnosis not present

## 2018-08-09 LAB — CBC WITH DIFFERENTIAL/PLATELET
Basophils Absolute: 0.1 10*3/uL (ref 0.0–0.1)
Basophils Relative: 0.8 % (ref 0.0–3.0)
Eosinophils Absolute: 0.1 10*3/uL (ref 0.0–0.7)
Eosinophils Relative: 0.9 % (ref 0.0–5.0)
HCT: 42.1 % (ref 36.0–46.0)
Hemoglobin: 14.3 g/dL (ref 12.0–15.0)
Lymphocytes Relative: 29.6 % (ref 12.0–46.0)
Lymphs Abs: 2.6 10*3/uL (ref 0.7–4.0)
MCHC: 33.9 g/dL (ref 30.0–36.0)
MCV: 92.7 fl (ref 78.0–100.0)
Monocytes Absolute: 0.5 10*3/uL (ref 0.1–1.0)
Monocytes Relative: 5.4 % (ref 3.0–12.0)
Neutro Abs: 5.5 10*3/uL (ref 1.4–7.7)
Neutrophils Relative %: 63.3 % (ref 43.0–77.0)
Platelets: 313 10*3/uL (ref 150.0–400.0)
RBC: 4.54 Mil/uL (ref 3.87–5.11)
RDW: 13.6 % (ref 11.5–15.5)
WBC: 8.6 10*3/uL (ref 4.0–10.5)

## 2018-08-09 LAB — COMPREHENSIVE METABOLIC PANEL
ALT: 19 U/L (ref 0–35)
AST: 21 U/L (ref 0–37)
Albumin: 4.5 g/dL (ref 3.5–5.2)
Alkaline Phosphatase: 78 U/L (ref 39–117)
BUN: 11 mg/dL (ref 6–23)
CO2: 27 mEq/L (ref 19–32)
Calcium: 9.8 mg/dL (ref 8.4–10.5)
Chloride: 102 mEq/L (ref 96–112)
Creatinine, Ser: 0.66 mg/dL (ref 0.40–1.20)
GFR: 99.43 mL/min (ref >60.00)
Glucose, Bld: 97 mg/dL (ref 70–99)
Potassium: 4.4 mEq/L (ref 3.5–5.1)
Sodium: 138 mEq/L (ref 135–145)
Total Bilirubin: 0.3 mg/dL (ref 0.2–1.2)
Total Protein: 7.5 g/dL (ref 6.0–8.3)

## 2018-08-09 LAB — VITAMIN D 25 HYDROXY (VIT D DEFICIENCY, FRACTURES): VITD: 41.1 ng/mL (ref 30.00–100.00)

## 2018-08-09 LAB — SEDIMENTATION RATE: Sed Rate: 62 mm/hr — ABNORMAL HIGH (ref 0–30)

## 2018-08-09 LAB — C-REACTIVE PROTEIN: CRP: 0.7 mg/dL (ref 0.5–20.0)

## 2018-08-09 MED ORDER — BACLOFEN 10 MG PO TABS: 10.0000 mg | ORAL_TABLET | Freq: Three times a day (TID) | ORAL | 0 refills | Status: DC

## 2018-08-09 MED ORDER — DIAZEPAM 5 MG PO TABS: 5.0000 mg | ORAL_TABLET | Freq: Four times a day (QID) | ORAL | 0 refills | Status: DC | PRN

## 2018-08-10 ENCOUNTER — Encounter: Payer: Self-pay | Admitting: Family Medicine

## 2018-08-27 DIAGNOSIS — M792 Neuralgia and neuritis, unspecified: Secondary | ICD-10-CM | POA: Diagnosis not present

## 2018-08-27 DIAGNOSIS — M5136 Other intervertebral disc degeneration, lumbar region: Secondary | ICD-10-CM | POA: Diagnosis not present

## 2018-08-27 DIAGNOSIS — G35 Multiple sclerosis: Secondary | ICD-10-CM | POA: Diagnosis not present

## 2018-08-27 DIAGNOSIS — M62838 Other muscle spasm: Secondary | ICD-10-CM | POA: Diagnosis not present

## 2018-09-09 ENCOUNTER — Other Ambulatory Visit: Payer: Self-pay | Admitting: Family Medicine

## 2018-09-09 DIAGNOSIS — M5416 Radiculopathy, lumbar region: Secondary | ICD-10-CM

## 2018-09-09 DIAGNOSIS — G35 Multiple sclerosis: Secondary | ICD-10-CM

## 2018-10-04 DIAGNOSIS — G35 Multiple sclerosis: Secondary | ICD-10-CM | POA: Diagnosis not present

## 2018-10-13 ENCOUNTER — Encounter: Payer: Self-pay | Admitting: Family Medicine

## 2018-10-13 DIAGNOSIS — G8929 Other chronic pain: Secondary | ICD-10-CM

## 2018-10-13 DIAGNOSIS — R519 Headache, unspecified: Secondary | ICD-10-CM

## 2018-10-13 DIAGNOSIS — R51 Headache: Principal | ICD-10-CM

## 2018-10-14 MED ORDER — TOPIRAMATE 25 MG PO TABS: 25.0000 mg | ORAL_TABLET | Freq: Two times a day (BID) | ORAL | 6 refills | Status: DC

## 2018-11-08 ENCOUNTER — Ambulatory Visit: Payer: 59 | Admitting: Family Medicine

## 2019-01-04 ENCOUNTER — Other Ambulatory Visit: Payer: Self-pay | Admitting: Family Medicine

## 2019-01-04 DIAGNOSIS — Z1231 Encounter for screening mammogram for malignant neoplasm of breast: Secondary | ICD-10-CM

## 2019-03-07 ENCOUNTER — Other Ambulatory Visit: Payer: Self-pay

## 2019-03-07 ENCOUNTER — Ambulatory Visit
Admission: RE | Admit: 2019-03-07 | Discharge: 2019-03-07 | Disposition: A | Discharge status: Home / Self Care | Payer: 59 | Source: Ambulatory Visit | Attending: Family Medicine | Admitting: Family Medicine

## 2019-03-07 DIAGNOSIS — Z1231 Encounter for screening mammogram for malignant neoplasm of breast: Secondary | ICD-10-CM

## 2019-04-05 ENCOUNTER — Ambulatory Visit (INDEPENDENT_AMBULATORY_CARE_PROVIDER_SITE_OTHER): Payer: 59 | Admitting: Family Medicine

## 2019-04-05 ENCOUNTER — Other Ambulatory Visit: Payer: Self-pay

## 2019-04-05 ENCOUNTER — Encounter: Payer: Self-pay | Admitting: Family Medicine

## 2019-04-05 ENCOUNTER — Other Ambulatory Visit: Payer: Self-pay | Admitting: Family Medicine

## 2019-04-05 ENCOUNTER — Encounter

## 2019-04-05 VITALS — BP 140/90 | HR 70 | Temp 98.3°F | Ht 66.0 in | Wt 225.4 lb

## 2019-04-05 DIAGNOSIS — Z0000 Encounter for general adult medical examination without abnormal findings: Secondary | ICD-10-CM

## 2019-04-05 DIAGNOSIS — G35 Multiple sclerosis: Secondary | ICD-10-CM

## 2019-04-05 DIAGNOSIS — E7800 Pure hypercholesterolemia, unspecified: Secondary | ICD-10-CM | POA: Diagnosis not present

## 2019-04-05 DIAGNOSIS — R5381 Other malaise: Secondary | ICD-10-CM | POA: Diagnosis not present

## 2019-04-05 DIAGNOSIS — F331 Major depressive disorder, recurrent, moderate: Secondary | ICD-10-CM

## 2019-04-05 DIAGNOSIS — G8929 Other chronic pain: Secondary | ICD-10-CM

## 2019-04-05 DIAGNOSIS — F5102 Adjustment insomnia: Secondary | ICD-10-CM

## 2019-04-05 DIAGNOSIS — E669 Obesity, unspecified: Secondary | ICD-10-CM | POA: Diagnosis not present

## 2019-04-05 DIAGNOSIS — E559 Vitamin D deficiency, unspecified: Secondary | ICD-10-CM | POA: Diagnosis not present

## 2019-04-05 DIAGNOSIS — R519 Headache, unspecified: Secondary | ICD-10-CM

## 2019-04-05 DIAGNOSIS — F40232 Fear of other medical care: Secondary | ICD-10-CM

## 2019-04-05 DIAGNOSIS — R5383 Other fatigue: Secondary | ICD-10-CM

## 2019-04-05 MED ORDER — ALPRAZOLAM 0.5 MG PO TABS: 0.5000 mg | ORAL_TABLET | Freq: Every day | ORAL | 0 refills | Status: AC | PRN

## 2019-04-05 MED ORDER — LORAZEPAM 0.5 MG PO TABS: 0.5000 mg | ORAL_TABLET | Freq: Every day | ORAL | 1 refills | Status: DC

## 2019-04-05 NOTE — Telephone Encounter (Signed)
Last fill 10/14/18  #60/6 Last OV 04/05/19

## 2019-04-06 ENCOUNTER — Encounter: Payer: 59 | Admitting: Family Medicine

## 2019-04-06 LAB — CBC WITH DIFFERENTIAL/PLATELET
Basophils Absolute: 0.1 10*3/uL (ref 0.0–0.1)
Basophils Relative: 0.9 % (ref 0.0–3.0)
Eosinophils Absolute: 0.1 10*3/uL (ref 0.0–0.7)
Eosinophils Relative: 0.8 % (ref 0.0–5.0)
HCT: 39.5 % (ref 36.0–46.0)
Hemoglobin: 13.2 g/dL (ref 12.0–15.0)
Lymphocytes Relative: 26.7 % (ref 12.0–46.0)
Lymphs Abs: 2.3 10*3/uL (ref 0.7–4.0)
MCHC: 33.4 g/dL (ref 30.0–36.0)
MCV: 93.3 fl (ref 78.0–100.0)
Monocytes Absolute: 0.5 10*3/uL (ref 0.1–1.0)
Monocytes Relative: 5.9 % (ref 3.0–12.0)
Neutro Abs: 5.5 10*3/uL (ref 1.4–7.7)
Neutrophils Relative %: 65.7 % (ref 43.0–77.0)
Platelets: 293 10*3/uL (ref 150.0–400.0)
RBC: 4.24 Mil/uL (ref 3.87–5.11)
RDW: 13.4 % (ref 11.5–15.5)
WBC: 8.4 10*3/uL (ref 4.0–10.5)

## 2019-04-06 LAB — LIPID PANEL
Cholesterol: 219 mg/dL — ABNORMAL HIGH (ref 0–200)
HDL: 69.7 mg/dL (ref >39.00)
LDL Cholesterol: 138 mg/dL — ABNORMAL HIGH (ref 0–99)
NonHDL: 149.74
Total CHOL/HDL Ratio: 3
Triglycerides: 61 mg/dL (ref 0.0–149.0)
VLDL: 12.2 mg/dL (ref 0.0–40.0)

## 2019-04-06 LAB — COMPREHENSIVE METABOLIC PANEL
ALT: 19 U/L (ref 0–35)
AST: 21 U/L (ref 0–37)
Albumin: 4.4 g/dL (ref 3.5–5.2)
Alkaline Phosphatase: 80 U/L (ref 39–117)
BUN: 15 mg/dL (ref 6–23)
CO2: 24 mEq/L (ref 19–32)
Calcium: 9.6 mg/dL (ref 8.4–10.5)
Chloride: 101 mEq/L (ref 96–112)
Creatinine, Ser: 0.63 mg/dL (ref 0.40–1.20)
GFR: 98.46 mL/min (ref >60.00)
Glucose, Bld: 86 mg/dL (ref 70–99)
Potassium: 3.9 mEq/L (ref 3.5–5.1)
Sodium: 136 mEq/L (ref 135–145)
Total Bilirubin: 0.4 mg/dL (ref 0.2–1.2)
Total Protein: 7.2 g/dL (ref 6.0–8.3)

## 2019-04-06 LAB — VITAMIN B12: Vitamin B-12: 1407 pg/mL — ABNORMAL HIGH (ref 211–911)

## 2019-04-06 LAB — VITAMIN D 25 HYDROXY (VIT D DEFICIENCY, FRACTURES): VITD: 35.42 ng/mL (ref 30.00–100.00)

## 2019-04-06 LAB — TSH: TSH: 1.14 u[IU]/mL (ref 0.35–4.50)

## 2019-04-08 NOTE — Progress Notes (Signed)
Subjective:    Laura Maldonado is a 53 y.o. female and is here for a comprehensive physical exam.  Health Maintenance Due  Topic Date Due  . INFLUENZA VACCINE  03/19/2019     Current Outpatient Medications:  .  albuterol (PROVENTIL HFA;VENTOLIN HFA) 108 (90 Base) MCG/ACT inhaler, TAKE 2 PUFFS BY MOUTH EVERY 6 HOURS AS NEEDED FOR WHEEZE OR SHORTNESS OF BREATH, Disp: 6.7 Inhaler, Rfl: 0 .  B Complex Vitamins (B COMPLEX 100 PO), Take 1 capsule by mouth daily., Disp: , Rfl:  .  baclofen (LIORESAL) 10 MG tablet, TAKE 1 TABLET BY MOUTH THREE TIMES A DAY, Disp: 30 tablet, Rfl: 0 .  Calcium Carb-Cholecalciferol (CALCIUM 1000 + D PO), Take 1 tablet by mouth daily., Disp: , Rfl:  .  diazepam (VALIUM) 5 MG tablet, Take 1 tablet (5 mg total) by mouth every 6 (six) hours as needed for anxiety., Disp: 5 tablet, Rfl: 0 .  Digestive Enzyme CAPS, Take by mouth. Every meal, Disp: , Rfl:  .  Magnesium 500 MG TABS, Take by mouth., Disp: , Rfl:  .  Probiotic Product (PROBIOTIC MATURE ADULT) CAPS, Take by mouth., Disp: , Rfl:  .  Turmeric 500 MG CAPS, Take by mouth., Disp: , Rfl:  .  ALPRAZolam (XANAX) 0.5 MG tablet, Take 1 tablet (0.5 mg total) by mouth daily as needed for anxiety (for MRI)., Disp: 10 tablet, Rfl: 0 .  beclomethasone (QVAR REDIHALER) 80 MCG/ACT inhaler, Inhale 2 puffs into the lungs 2 (two) times daily., Disp: 1 Inhaler, Rfl: 1 .  BIOTIN PO, Take 1 capsule by mouth daily., Disp: , Rfl:  .  ketorolac (TORADOL) 10 MG tablet, Take 1 tablet (10 mg total) by mouth every 6 (six) hours as needed. (Patient not taking: Reported on 04/05/2019), Disp: 20 tablet, Rfl: 0 .  LORazepam (ATIVAN) 0.5 MG tablet, Take 1 tablet (0.5 mg total) by mouth at bedtime., Disp: 30 tablet, Rfl: 1 .  topiramate (TOPAMAX) 25 MG tablet, TAKE 1 TABLET BY MOUTH TWICE A DAY, Disp: 180 tablet, Rfl: 2  PMHx, SurgHx, SocialHx, Medications, and Allergies were reviewed in the Visit Navigator and updated as appropriate.    Past Medical History:  Diagnosis Date  . Allergy   . Anxiety   . Cancer ([place])    melanoma  . Chicken pox   . Diverticulitis   . Frequent headaches   . GERD (gastroesophageal reflux disease)   . Heart murmur    past hx detected by one MD  . Migraines   . Multiple sclerosis ([place]) 05/29/2016   [facility] q 6 months.  . Neuromuscular disorder ([place])    MS  . Obesity (BMI 30-39.9) 01/15/2017  . Pure hypercholesterolemia 05/29/2016  . Vitamin D deficiency 05/29/2016     Past Surgical History:  Procedure Laterality Date  . ABDOMINAL HYSTERECTOMY    . BREAST EXCISIONAL BIOPSY Left 2007  . CESAREAN SECTION    . CHOLECYSTECTOMY    . COLONOSCOPY    . MELANOMA EXCISION  1986  . MOLE REMOVAL    . UPPER GASTROINTESTINAL ENDOSCOPY       Family History  Problem Relation Age of Onset  . Hypertension Mother   . Heart disease Father   . Hypertension Maternal Grandmother   . Hypertension Maternal Grandfather   . Hypertension Paternal Grandmother   . Hypertension Paternal Grandfather   . Colon cancer Neg Hx   . Colon polyps Neg Hx   . Esophageal cancer Neg Hx   .  Rectal cancer Neg Hx   . Stomach cancer Neg Hx     Social History   Tobacco Use  . Smoking status: Never Smoker  . Smokeless tobacco: Never Used  Substance Use Topics  . Alcohol use: Yes    Comment: rare- maybe 5 x a year   . Drug use: No    Review of Systems:   Pertinent items are noted in the HPI. Otherwise, ROS is negative.  Objective:   BP 140/90 (BP Location: Left Arm, Patient Position: Sitting, Cuff Size: Normal)   Pulse 70   Temp 98.3 F (36.8 C) (Temporal)   Ht 5\' 6"  (1.676 m)   Wt 225 lb 6.4 oz (102.2 kg)   SpO2 98%   BMI 36.38 kg/m   General appearance: alert, cooperative and appears stated age. Head: normocephalic, without obvious abnormality, atraumatic. Neck: no adenopathy, supple, symmetrical, trachea midline; thyroid not enlarged, symmetric, no tenderness/mass/nodules. Lungs: clear  to auscultation bilaterally. Heart: regular rate and rhythm Abdomen: soft, non-tender; no masses,  no organomegaly. Extremities: extremities normal, atraumatic, no cyanosis or edema. Skin: skin color, texture, turgor normal, no rashes or lesions. Lymph: cervical, supraclavicular, and axillary nodes normal; no abnormal inguinal nodes palpated. Neurologic: grossly normal.        Assessment/Plan:   Laura Maldonado was seen today for annual exam.  Diagnoses and all orders for this visit:  Vitamin D deficiency -     VITAMIN D 25 Hydroxy (Vit-D Deficiency, Fractures)  Routine physical examination  Pure hypercholesterolemia -     Comprehensive metabolic panel -     Lipid panel  Obesity (BMI 30-39.9)  Multiple sclerosis (H[facility])  Moderate episode of recurrent major depressive disorder (H[facility])  Fear of other medical care -     ALPRAZolam (XANAX) 0.5 MG tablet; Take 1 tablet (0.5 mg total) by mouth daily as needed for anxiety (for MRI).  Adjustment insomnia -     LORazepam (ATIVAN) 0.5 MG tablet; Take 1 tablet (0.5 mg total) by mouth at bedtime.  Malaise and fatigue -     CBC with Differential/Platelet -     TSH -     Vitamin B12   Patient Counseling: [x]    Nutrition: Stressed importance of moderation in sodium/caffeine intake, saturated fat and cholesterol, caloric balance, sufficient intake of fresh fruits, vegetables, fiber, calcium, iron, and 1 mg of folate supplement per day (for females capable of pregnancy).  [x]    Stressed the importance of regular exercise.   [x]    Substance Abuse: Discussed cessation/primary prevention of tobacco, alcohol, or other drug use; driving or other dangerous activities under the influence; availability of treatment for abuse.   [x]    Injury prevention: Discussed safety belts, safety helmets, smoke detector, smoking near bedding or upholstery.   [x]    Sexuality: Discussed sexually transmitted diseases, partner selection, use of condoms, avoidance of unintended  pregnancy  and contraceptive alternatives.  [x]    Dental health: Discussed importance of regular tooth brushing, flossing, and dental visits.  [x]    Health maintenance and immunizations reviewed. Please refer to Health maintenance section.   Briscoe Deutscher, DO [place]

## 2019-04-18 ENCOUNTER — Other Ambulatory Visit: Payer: Self-pay

## 2019-04-18 ENCOUNTER — Ambulatory Visit (INDEPENDENT_AMBULATORY_CARE_PROVIDER_SITE_OTHER): Payer: 59 | Admitting: Family Medicine

## 2019-04-18 ENCOUNTER — Encounter: Payer: Self-pay | Admitting: Family Medicine

## 2019-04-18 VITALS — BP 120/72 | HR 71 | Temp 97.9°F | Ht 66.0 in | Wt 224.2 lb

## 2019-04-18 DIAGNOSIS — M5416 Radiculopathy, lumbar region: Secondary | ICD-10-CM | POA: Diagnosis not present

## 2019-04-18 DIAGNOSIS — M26609 Unspecified temporomandibular joint disorder, unspecified side: Secondary | ICD-10-CM

## 2019-04-18 MED ORDER — BACLOFEN 10 MG PO TABS: 10.0000 mg | ORAL_TABLET | Freq: Three times a day (TID) | ORAL | 1 refills | Status: DC

## 2019-04-18 NOTE — Patient Instructions (Signed)

## 2019-04-18 NOTE — Progress Notes (Signed)
Patient: Laura Maldonado MRN: [id number] DOB: 05-07-1966 PCP: Briscoe Deutscher, DO     Subjective:  Chief Complaint  Patient presents with  . R ear pain  . sinus pain (worse on R side)    HPI: The patient is a 53 y.o. female who presents today for right ear pain and sinus pain. She states symptoms started last Thursday. She was stuck outside for a while talking to a neighbor. Grass had been freshly cut and that evening she started to have stabbing pains in her right ear. By Friday when she went to eat or drink she would have pain in her ear canal. She also has pain with opening and closing her jaw. She took some advil sinus congestion since Thursday and this seems to have helped. While she was in bed she will have random shooting pain in her right ear. Drinking water even hurts her with pain going into her ear and down her neck. No fever/chills. She has no congestion or rhinorrhea. She doesn't really have pain or congestion in her sinus areas. No known sick contacts. She has been at home, but has been to her derm and to grocery store. She does wear a night guard at night and is under more stress. She has not been sleeping well. Has some neck pain.   Review of Systems  Constitutional: Positive for fatigue. Negative for chills and fever.  HENT: Positive for ear pain (right ear ). Negative for congestion, sinus pressure, sinus pain and sore throat.   Eyes: Negative for photophobia and pain.  Respiratory: Negative for cough, chest tightness and shortness of breath.   Cardiovascular: Negative for chest pain and palpitations.  Gastrointestinal: Negative for abdominal pain, diarrhea, nausea and vomiting.  Musculoskeletal: Positive for neck stiffness. Negative for back pain and neck pain.  Neurological: Positive for headaches. Negative for dizziness.  Psychiatric/Behavioral: Positive for sleep disturbance.    Allergies Patient is allergic to bee venom; shellfish allergy; interferons;  acetaminophen; codeine; flagyl [metronidazole]; gabapentin; gluten meal; lactose; lamotrigine; latex; and oseltamivir.  Past Medical History Patient  has a past medical history of Allergy, Anxiety, Cancer ([place]), Chicken pox, Diverticulitis, Frequent headaches, GERD (gastroesophageal reflux disease), Heart murmur, Migraines, Multiple sclerosis ([place]) (05/29/2016), Neuromuscular disorder ([place]), Obesity (BMI 30-39.9) (01/15/2017), Pure hypercholesterolemia (05/29/2016), and Vitamin D deficiency (05/29/2016).  Surgical History Patient  has a past surgical history that includes Cholecystectomy; Abdominal hysterectomy; Melanoma excision (1986); Mole removal; Colonoscopy; Upper gastrointestinal endoscopy; Cesarean section; and Breast excisional biopsy (Left, 2007).  Family History Pateint's family history includes Heart disease in her father; Hypertension in her maternal grandfather, maternal grandmother, mother, paternal grandfather, and paternal grandmother.  Social History Patient  reports that she has never smoked. She has never used smokeless tobacco. She reports current alcohol use. She reports that she does not use drugs.    Objective: Vitals:   04/18/19 1431  BP: 120/72  Pulse: 71  Temp: 97.9 F (36.6 C)  TempSrc: Skin  SpO2: 97%  Weight: 224 lb 3.2 oz (101.7 kg)  Height: 5\' 6"  (1.676 m)    Body mass index is 36.19 kg/m.  Physical Exam Vitals signs reviewed.  Constitutional:      Appearance: Normal appearance. She is obese.  HENT:     Head: Normocephalic and atraumatic.     Comments: No popping of right TMJ, TTP over right TMJ joint    Right Ear: Tympanic membrane, ear canal and external ear normal.     Left Ear: Tympanic membrane, ear  canal and external ear normal.     Nose: Nose normal.     Mouth/Throat:     Mouth: Mucous membranes are moist.  Eyes:     Extraocular Movements: Extraocular movements intact.     Pupils: Pupils are equal, round, and reactive to light.  Neck:      Musculoskeletal: Normal range of motion and neck supple. No neck rigidity.  Cardiovascular:     Rate and Rhythm: Normal rate and regular rhythm.     Heart sounds: Normal heart sounds.  Pulmonary:     Effort: Pulmonary effort is normal.     Breath sounds: Normal breath sounds.  Lymphadenopathy:     Cervical: No cervical adenopathy.  Neurological:     Mental Status: She is alert.        Assessment/plan: 1. TMJ (temporomandibular joint disorder) Baclofen prn, ice/heat to jaw, nsaid prn. Has dentist appointment next week and will have them examine. Let us know if not getting better.      Return if symptoms worsen or fail to improve.   Orma Flaming, MD [place]   04/18/2019

## 2019-04-22 ENCOUNTER — Ambulatory Visit: Payer: 59 | Admitting: Family Medicine

## 2019-06-07 ENCOUNTER — Telehealth: Payer: Self-pay

## 2019-06-07 NOTE — Telephone Encounter (Signed)
Called and l/m to let patient know that forms are ready for pick up.

## 2019-06-07 NOTE — Telephone Encounter (Signed)
Copied from [place] [id number]. Topic: General - Other >> Jun 07, 2019  8:57 AM Pauline Good wrote: Reason for CRM: pt calling and want to know the status of the [employer] insurance forms she dropped off 10.13.20. Pt stated she was told by the lady at the desk it would take 3-5 days to fill out. Please call pt so she can come and pick them up when ready

## 2019-06-29 IMAGING — CT CT ABD-PELV W/ CM
2 of 5 series · 16 of 46 positions shown, 18 images · IV contrast (APPLIED)
Comparison: None.

CLINICAL DATA: Left lower quadrant pain.  History of melanoma

EXAM:
CT ABDOMEN AND PELVIS WITH CONTRAST
TECHNIQUE: Multidetector CT imaging of the abdomen and pelvis was performed
using the standard protocol following bolus administration of
intravenous contrast.
CONTRAST:  100mL KVIV9Z-EBB IOPAMIDOL (KVIV9Z-EBB) INJECTION 61%

[Series 2: axial st · axial · 0.83mm/px · z∈[-470,-45]mm · 13 of 95 slices shown, 15 images]
[im 5/95  soft-tissue]
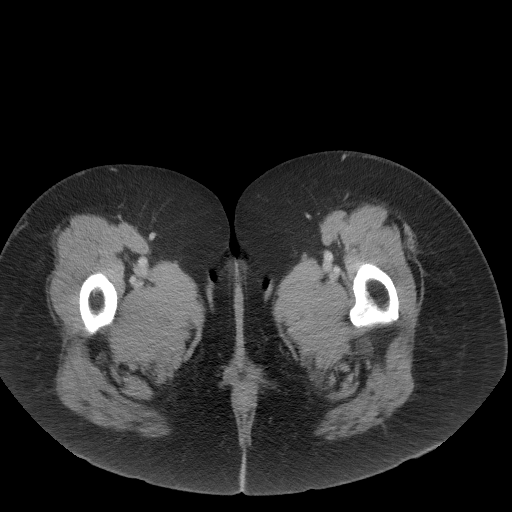
[im 5/95  bone]
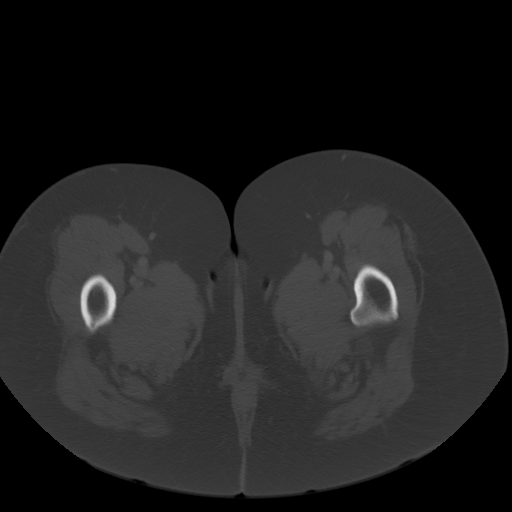
[im 15/95  soft-tissue]
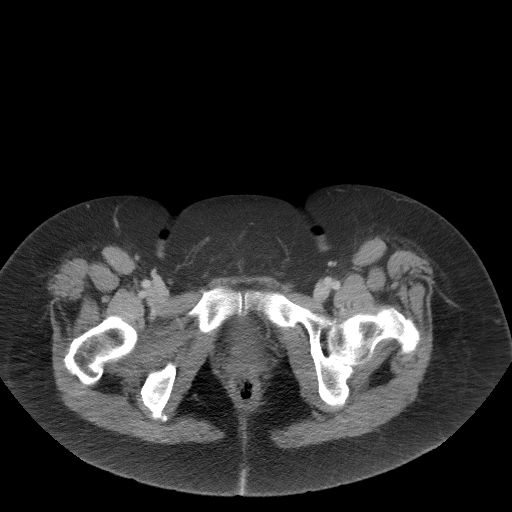
[im 20/95  soft-tissue]
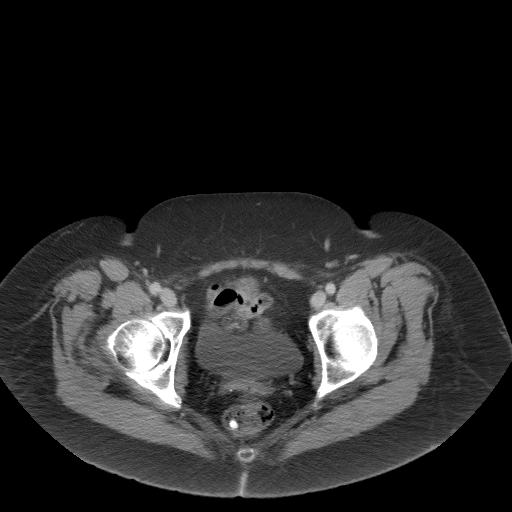
[im 25/95  soft-tissue]
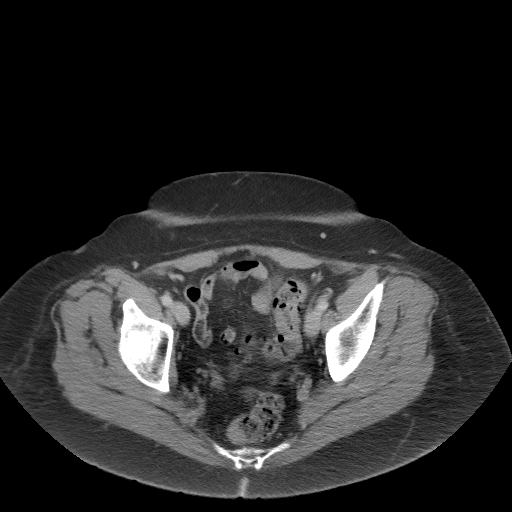
[im 35/95  soft-tissue]
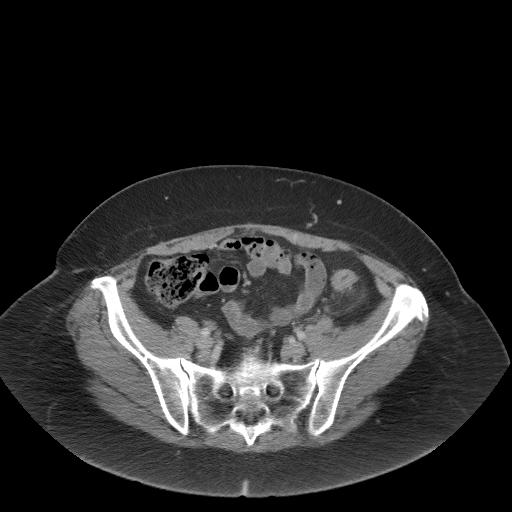
[im 40/95  soft-tissue]
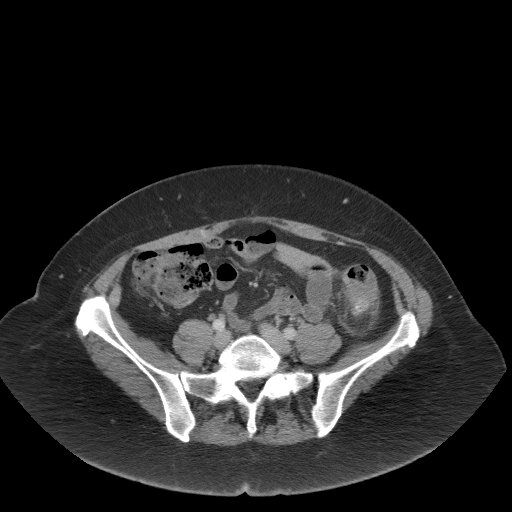
[im 50/95  soft-tissue]
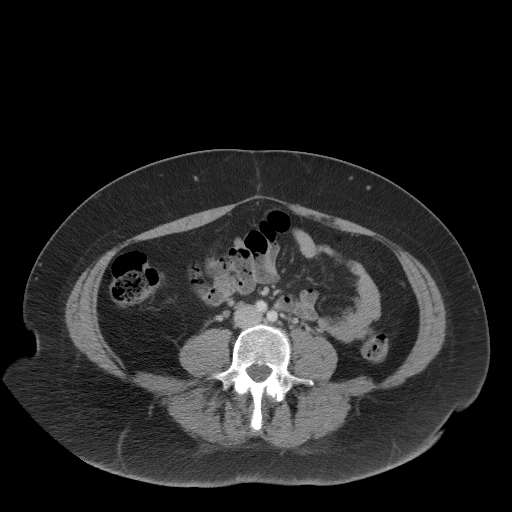
[im 55/95  soft-tissue]
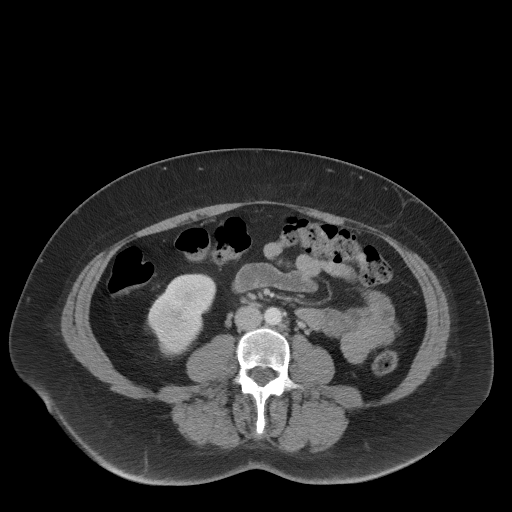
[im 60/95  soft-tissue]
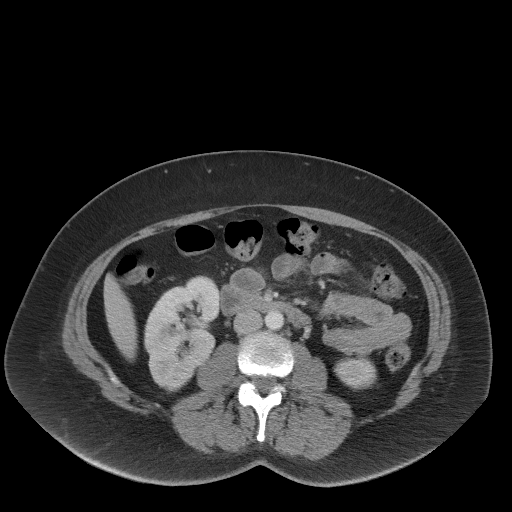
[im 60/95  bone]
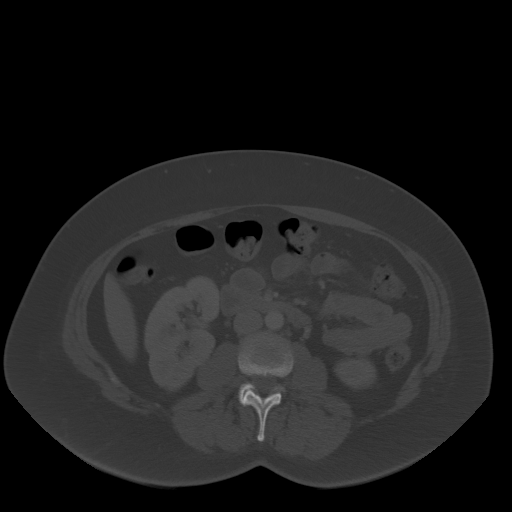
[im 70/95  soft-tissue]
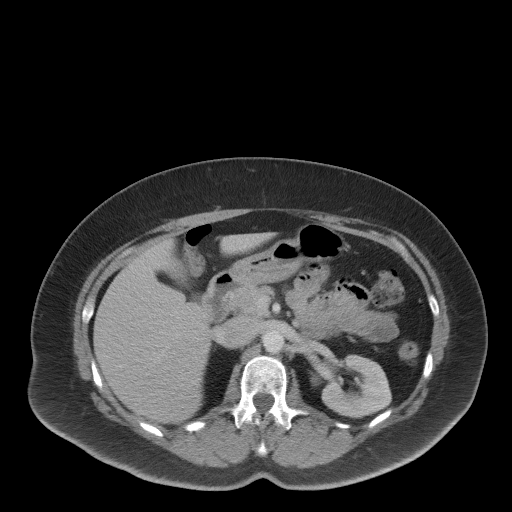
[im 75/95  soft-tissue]
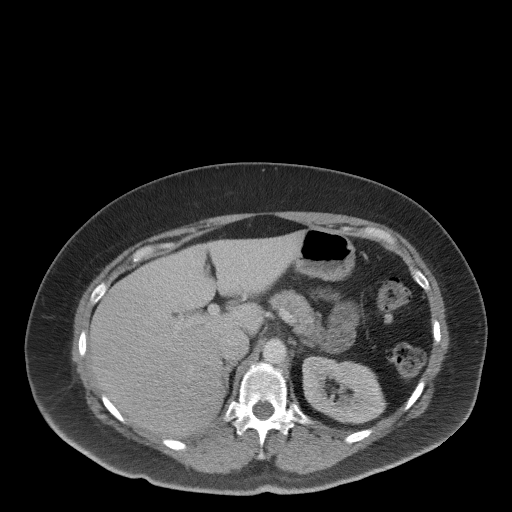
[im 80/95  soft-tissue]
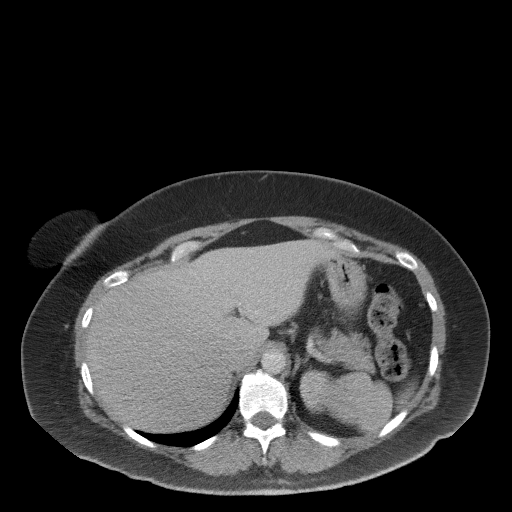
[im 90/95  soft-tissue]
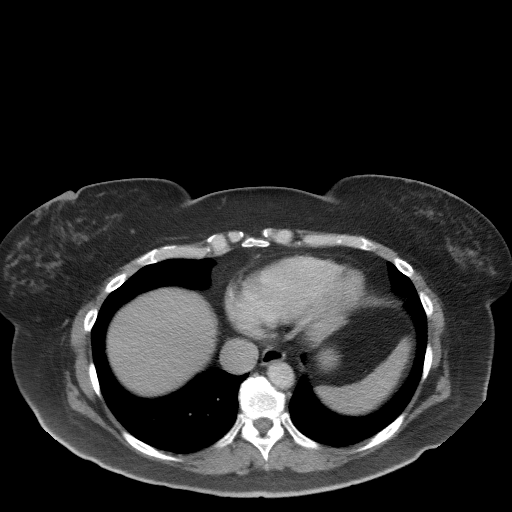

[Series 5: coronal st · coronal · 0.86mm/px · 3 of 100 slices shown]
[im 34/100  soft-tissue]
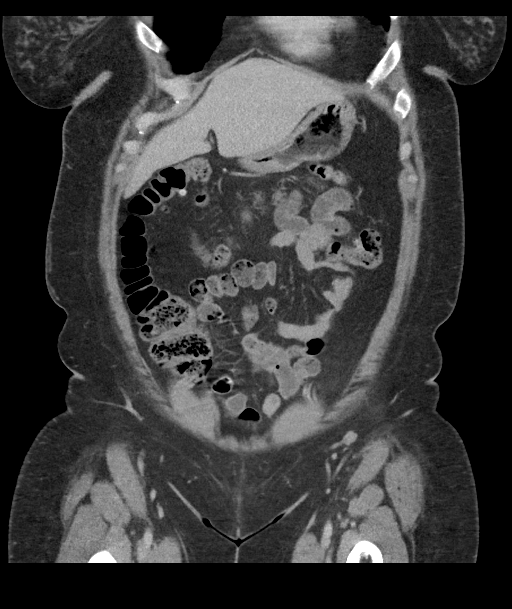
[im 45/100  soft-tissue]
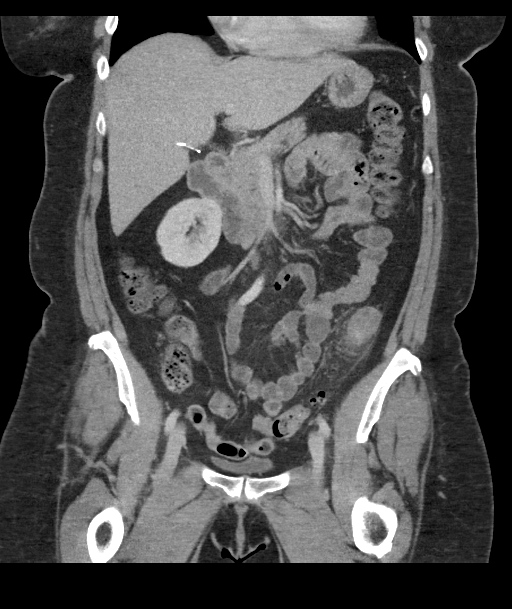
[im 56/100  soft-tissue]
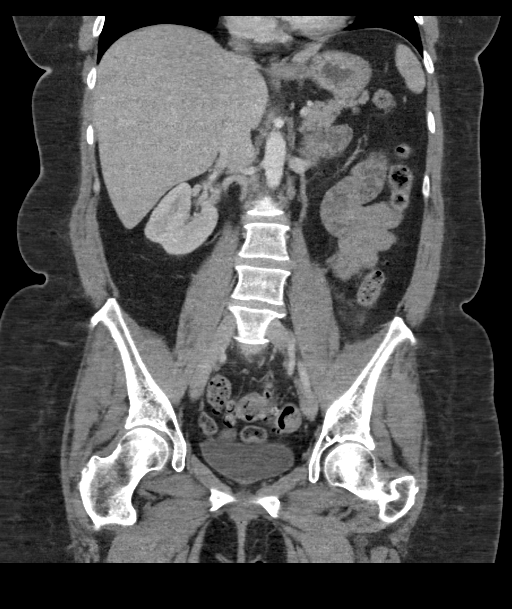

[16 of 46 positions shown; findings below may reference images not displayed]

FINDINGS: Lower chest: Lung bases are clear.

Hepatobiliary: No focal liver lesions are evident. Gallbladder is
absent. There is no biliary duct dilatation.

Pancreas: No pancreatic mass or inflammatory focus.

Spleen: No splenic lesions are evident.

Adrenals/Urinary Tract: Adrenals bilaterally appear normal. Kidneys
bilaterally show no evident mass or hydronephrosis on either side.
No evident renal or ureteral calculus on either side. Urinary
bladder is midline with wall thickness within normal limits.

Stomach/Bowel: There is diverticulitis at the junction of the
descending colon and sigmoid colon with irregular diverticula as
well as mild fluid and mesenteric thickening in this area. There is
mild colon wall thickening in this area of diverticulitis. No
abscess or perforation is seen in this area diverticulitis. There
are multiple diverticula throughout the remainder of the sigmoid
colon without diverticulitis elsewhere. There is no evident bowel
obstruction. No free air or portal venous air.

Vascular/Lymphatic: No abdominal aortic aneurysm. No vascular
lesions are evident in the abdomen or pelvis. There is no adenopathy
in the abdomen or pelvis.

Reproductive: Uterus is absent.  No evident pelvic mass.

Other: Appendix appears normal. There is no abscess or ascites in
the abdomen or pelvis. There is a small ventral hernia containing
only fat.

Musculoskeletal: There are no blastic or lytic bone lesions. There
is no intramuscular or abdominal wall lesion evident.
IMPRESSION: 1. Focal diverticulitis at the junction of the descending colon and
sigmoid colon. No perforation or abscess noted in this region. There
is sigmoid diverticulosis elsewhere in the sigmoid colon without
diverticulitis elsewhere.

2.  No bowel obstruction.  No abscess.  Appendix appears normal.

3.  No renal or ureteral calculus.  No hydronephrosis.

4.  Gallbladder absent.  Uterus absent.

5.  Small ventral hernia containing only fat.

## 2019-07-26 ENCOUNTER — Encounter: Payer: 59 | Admitting: Physician Assistant

## 2019-08-18 ENCOUNTER — Other Ambulatory Visit: Payer: Self-pay

## 2019-08-22 ENCOUNTER — Ambulatory Visit (INDEPENDENT_AMBULATORY_CARE_PROVIDER_SITE_OTHER): Payer: 59 | Admitting: Family Medicine

## 2019-08-22 ENCOUNTER — Encounter

## 2019-08-22 ENCOUNTER — Ambulatory Visit (INDEPENDENT_AMBULATORY_CARE_PROVIDER_SITE_OTHER): Payer: 59

## 2019-08-22 ENCOUNTER — Encounter: Payer: Self-pay | Admitting: Family Medicine

## 2019-08-22 VITALS — BP 114/65 | HR 70 | Temp 97.3°F | Ht 66.0 in | Wt 225.0 lb

## 2019-08-22 DIAGNOSIS — G8929 Other chronic pain: Secondary | ICD-10-CM

## 2019-08-22 DIAGNOSIS — M25511 Pain in right shoulder: Secondary | ICD-10-CM

## 2019-08-22 DIAGNOSIS — C439 Malignant melanoma of skin, unspecified: Secondary | ICD-10-CM | POA: Insufficient documentation

## 2019-08-22 DIAGNOSIS — K5790 Diverticulosis of intestine, part unspecified, without perforation or abscess without bleeding: Secondary | ICD-10-CM | POA: Insufficient documentation

## 2019-08-22 NOTE — Progress Notes (Signed)
Patient: Laura Maldonado MRN: [id number] DOB: 1966-06-19 PCP: Orma Flaming, MD      Subjective:  Chief Complaint  Patient presents with  . Transitions Of Care  . R shoulder popping/pain    symptoms x several months    HPI: The patient is a 54 y.o. female who presents today for transition of care. She has hx of MS, depression/anxiety, high cholesterol, headaches and vit. D deficiency. She has complaints of right shoulder pain today. She states pain is very intermittent. She states she can reach up and feel/hear a pop. She has a pain in her right bicep. She thinks its the computer mouse because if she doesn't use the mouse the pain improves. She first noticed this 6 months ago. Pain comes and goes with use. Popping is on her collar bone and pain is in bicep. Pain rated as a 6/10 at times and is described as dull. Rest, baclofen, motrin all make it better. It will take the pain away if she lays low. Drying hair, using mouse with computer makes It worse. She is right handed . No hx of trauma to that area. Did play tennis.   Review of Systems  Constitutional: Negative for chills, fatigue and fever.  HENT: Negative for dental problem, ear pain, hearing loss and trouble swallowing.   Eyes: Negative for visual disturbance.  Respiratory: Negative for cough, chest tightness and shortness of breath.   Cardiovascular: Negative for chest pain, palpitations and leg swelling.  Gastrointestinal: Negative for abdominal pain, blood in stool, diarrhea and nausea.  Endocrine: Negative for cold intolerance, polydipsia, polyphagia and polyuria.  Genitourinary: Negative for dysuria and hematuria.  Musculoskeletal: Positive for arthralgias (right shoulder pain x 6 months ).  Skin: Negative for rash.  Neurological: Negative for dizziness and headaches.  Psychiatric/Behavioral: Negative for dysphoric mood and sleep disturbance. The patient is not nervous/anxious.     Allergies Patient is allergic to bee  venom; shellfish allergy; interferons; acetaminophen; codeine; flagyl [metronidazole]; gabapentin; gluten meal; lactose; lamotrigine; latex; and oseltamivir.  Past Medical History Patient  has a past medical history of Allergy, Anxiety, Cancer ([place]), Chicken pox, Diverticulitis, Frequent headaches, GERD (gastroesophageal reflux disease), Heart murmur, Migraines, Multiple sclerosis ([place]) (05/29/2016), Neuromuscular disorder ([place]), Obesity (BMI 30-39.9) (01/15/2017), Pure hypercholesterolemia (05/29/2016), and Vitamin D deficiency (05/29/2016).  Surgical History Patient  has a past surgical history that includes Cholecystectomy; Abdominal hysterectomy; Melanoma excision (1986); Mole removal; Colonoscopy; Upper gastrointestinal endoscopy; Cesarean section; and Breast excisional biopsy (Left, 2007).  Family History Pateint's family history includes Heart disease in her father; Hypertension in her maternal grandfather, maternal grandmother, mother, paternal grandfather, and paternal grandmother.  Social History Patient  reports that she has never smoked. She has never used smokeless tobacco. She reports current alcohol use. She reports that she does not use drugs.    Objective: Vitals:   08/22/19 1259  BP: 114/65  Pulse: 70  Temp: (!) 97.3 F (36.3 C)  TempSrc: Skin  SpO2: 98%  Weight: 225 lb (102.1 kg)  Height: 5\' 6"  (1.676 m)    Body mass index is 36.32 kg/m.  Physical Exam Vitals reviewed.  Constitutional:      Appearance: Normal appearance.  HENT:     Head: Normocephalic and atraumatic.  Cardiovascular:     Rate and Rhythm: Normal rate and regular rhythm.     Heart sounds: Normal heart sounds.  Pulmonary:     Effort: Pulmonary effort is normal.     Breath sounds: Normal breath sounds.  Musculoskeletal:  General: Tenderness (right shoulder: TTP over glenohumeral joint and lateral aspect of her humerus. ttp over bicep. shoulder exam normal and ROM normal. passive arc  normal, gerber negative and negative speeds. ) present.  Neurological:     General: No focal deficit present.     Mental Status: She is alert and oriented to person, place, and time.    GAD 7 : Generalized Anxiety Score 08/22/2019  Nervous, Anxious, on Edge 0  Control/stop worrying 0  Worry too much - different things 0  Trouble relaxing 1  Restless 0  Easily annoyed or irritable 0  Afraid - awful might happen 0  Total GAD 7 Score 1  Anxiety Difficulty Not difficult at all    Depression screen [facility] 2/9 08/22/2019 02/26/2018 05/28/2017 05/28/2017  Decreased Interest 0 1 1 1   Down, Depressed, Hopeless 1 1 - -  PHQ - 2 Score 1 2 1 1   Altered sleeping - 0 0 -  Tired, decreased energy - 1 2 -  Change in appetite - 0 0 -  Feeling bad or failure about yourself  - 0 0 -  Trouble concentrating - 0 1 -  Moving slowly or fidgety/restless - 0 0 -  Suicidal thoughts - 0 0 -  PHQ-9 Score - 3 4 -  Difficult doing work/chores - Somewhat difficult - -   Shoulder xray: no acute findings. Official read pending     Assessment/plan: 1. Chronic right shoulder pain ? Impingement syndrome. Handout given with exercises, voltaren gel QID and discussed PT vs. Sports medicine referral. She really likes Dr. Paulla Fore and may follow up with him. Let me know if not getting better.  - DG Shoulder Right; Future  -chart reviewed.     This visit occurred during the SARS-CoV-2 public health emergency.  Safety protocols were in place, including screening questions prior to the visit, additional usage of staff PPE, and extensive cleaning of exam room while observing appropriate contact time as indicated for disinfecting solutions.     Return if symptoms worsen or fail to improve.   Orma Flaming, MD [place]   08/22/2019

## 2019-08-22 NOTE — Patient Instructions (Addendum)
[facility].   ?if you have shoulder impingement syndrome.  -trial voltaren gel up to four times a day on your bicep.  -giving you exercises then could f/u with dr. Paulla Fore.    So nice to meet you! Dr. Rogers Blocker

## 2019-08-24 ENCOUNTER — Encounter: Payer: Self-pay | Admitting: Family Medicine

## 2019-09-10 ENCOUNTER — Encounter: Payer: Self-pay | Admitting: Family Medicine

## 2019-09-13 ENCOUNTER — Encounter: Payer: Self-pay | Admitting: Family Medicine

## 2019-09-13 ENCOUNTER — Other Ambulatory Visit: Payer: Self-pay

## 2019-09-13 ENCOUNTER — Ambulatory Visit (INDEPENDENT_AMBULATORY_CARE_PROVIDER_SITE_OTHER): Payer: 59 | Admitting: Family Medicine

## 2019-09-13 VITALS — BP 122/84 | HR 65 | Temp 97.3°F | Ht 66.0 in | Wt 225.2 lb

## 2019-09-13 DIAGNOSIS — R252 Cramp and spasm: Secondary | ICD-10-CM | POA: Diagnosis not present

## 2019-09-13 DIAGNOSIS — Z8739 Personal history of other diseases of the musculoskeletal system and connective tissue: Secondary | ICD-10-CM

## 2019-09-13 DIAGNOSIS — E559 Vitamin D deficiency, unspecified: Secondary | ICD-10-CM

## 2019-09-13 DIAGNOSIS — G35 Multiple sclerosis: Secondary | ICD-10-CM

## 2019-09-13 DIAGNOSIS — R202 Paresthesia of skin: Secondary | ICD-10-CM | POA: Diagnosis not present

## 2019-09-13 DIAGNOSIS — Z8669 Personal history of other diseases of the nervous system and sense organs: Secondary | ICD-10-CM

## 2019-09-13 LAB — CBC WITH DIFFERENTIAL/PLATELET
Basophils Absolute: 0.1 10*3/uL (ref 0.0–0.1)
Basophils Relative: 0.9 % (ref 0.0–3.0)
Eosinophils Absolute: 0.1 10*3/uL (ref 0.0–0.7)
Eosinophils Relative: 1.3 % (ref 0.0–5.0)
HCT: 40 % (ref 36.0–46.0)
Hemoglobin: 13.4 g/dL (ref 12.0–15.0)
Lymphocytes Relative: 34.9 % (ref 12.0–46.0)
Lymphs Abs: 2 10*3/uL (ref 0.7–4.0)
MCHC: 33.4 g/dL (ref 30.0–36.0)
MCV: 92.9 fl (ref 78.0–100.0)
Monocytes Absolute: 0.5 10*3/uL (ref 0.1–1.0)
Monocytes Relative: 8.3 % (ref 3.0–12.0)
Neutro Abs: 3.2 10*3/uL (ref 1.4–7.7)
Neutrophils Relative %: 54.6 % (ref 43.0–77.0)
Platelets: 291 10*3/uL (ref 150.0–400.0)
RBC: 4.31 Mil/uL (ref 3.87–5.11)
RDW: 13.8 % (ref 11.5–15.5)
WBC: 5.8 10*3/uL (ref 4.0–10.5)

## 2019-09-13 LAB — COMPREHENSIVE METABOLIC PANEL
ALT: 22 U/L (ref 0–35)
AST: 22 U/L (ref 0–37)
Albumin: 4.3 g/dL (ref 3.5–5.2)
Alkaline Phosphatase: 79 U/L (ref 39–117)
BUN: 21 mg/dL (ref 6–23)
CO2: 25 mEq/L (ref 19–32)
Calcium: 9.6 mg/dL (ref 8.4–10.5)
Chloride: 104 mEq/L (ref 96–112)
Creatinine, Ser: 0.68 mg/dL (ref 0.40–1.20)
GFR: 90.01 mL/min (ref >60.00)
Glucose, Bld: 92 mg/dL (ref 70–99)
Potassium: 4.6 mEq/L (ref 3.5–5.1)
Sodium: 136 mEq/L (ref 135–145)
Total Bilirubin: 0.4 mg/dL (ref 0.2–1.2)
Total Protein: 7.2 g/dL (ref 6.0–8.3)

## 2019-09-13 LAB — VITAMIN D 25 HYDROXY (VIT D DEFICIENCY, FRACTURES): VITD: 34.44 ng/mL (ref 30.00–100.00)

## 2019-09-13 LAB — TSH: TSH: 2.1 u[IU]/mL (ref 0.35–4.50)

## 2019-09-13 LAB — B12 AND FOLATE PANEL
Folate: 15.2 ng/mL (ref >5.9)
Vitamin B-12: 1057 pg/mL — ABNORMAL HIGH (ref 211–911)

## 2019-09-13 LAB — SEDIMENTATION RATE: Sed Rate: 54 mm/hr — ABNORMAL HIGH (ref 0–30)

## 2019-09-13 NOTE — Patient Instructions (Signed)
Please return to see Dr. Rogers Blocker for further evaluation if your symptoms do not improve. As well, IF you experience worsening numbness in different areas, see your neurologist.   I will release your lab results to you on your MyChart account with further instructions. Please reply with any questions.   If you have any questions or concerns, please don't hesitate to send me a message via MyChart or call the office at [phone]. Thank you for visiting with [place] today! It's our pleasure caring for you.   Paresthesia Paresthesia is an abnormal burning or prickling sensation. It is usually felt in the hands, arms, legs, or feet. However, it may occur in any part of the body. Usually, paresthesia is not painful. It may feel like:  Tingling or numbness.  Buzzing.  Itching. Paresthesia may occur without any clear cause, or it may be caused by:  Breathing too quickly (hyperventilation).  Pressure on a nerve.  An underlying medical condition.  Side effects of a medication.  Nutritional deficiencies.  Exposure to toxic chemicals. Most people experience temporary (transient) paresthesia at some time in their lives. For some people, it may be long-lasting (chronic) because of an underlying medical condition. If you have paresthesia that lasts a long time, you may need to be evaluated by your health care provider. Follow these instructions at home: Alcohol use   Do not drink alcohol if: ? Your health care provider tells you not to drink. ? You are pregnant, may be pregnant, or are planning to become pregnant.  If you drink alcohol: ? Limit how much you use to:  0-1 drink a day for women.  0-2 drinks a day for men. ? Be aware of how much alcohol is in your drink. In the [place] one drink equals one 12 oz bottle of beer (355 mL), one 5 oz glass of wine (148 mL), or one 1 oz glass of hard liquor (44 mL). Nutrition   Eat a healthy diet. This includes: ? Eating foods that are high in  fiber, such as fresh fruits and vegetables, whole grains, and beans. ? Limiting foods that are high in fat and processed sugars, such as fried or sweet foods. General instructions  Take over-the-counter and prescription medicines only as told by your health care provider.  Do not use any products that contain nicotine or tobacco, such as cigarettes and e-cigarettes. These can keep blood from reaching damaged nerves. If you need help quitting, ask your health care provider.  If you have diabetes, work closely with your health care provider to keep your blood sugar under control.  If you have numbness in your feet: ? Check every day for signs of injury or infection. Watch for redness, warmth, and swelling. ? Wear padded socks and comfortable shoes. These help protect your feet.  Keep all follow-up visits as told by your health care provider. This is important. Contact a health care provider if you:  Have paresthesia that gets worse or does not go away.  Have a burning or prickling feeling that gets worse when you walk.  Have pain, cramps, or dizziness.  Develop a rash. Get help right away if you:  Feel weak.  Have trouble walking or moving.  Have problems with speech, understanding, or vision.  Feel confused.  Cannot control your bladder or bowel movements.  Have numbness after an injury.  Develop new weakness in an arm or leg.  Faint. Summary  Paresthesia is an abnormal burning or prickling sensation that is  usually felt in the hands, arms, legs, or feet. It may also occur in other parts of the body.  Paresthesia may occur without any clear cause, or it may be caused by breathing too quickly (hyperventilation), pressure on a nerve, an underlying medical condition, side effects of a medication, nutritional deficiencies, or exposure to toxic chemicals.  If you have paresthesia that lasts a long time, you may need to be evaluated by your health care provider. This  information is not intended to replace advice given to you by your health care provider. Make sure you discuss any questions you have with your health care provider. Document Revised: 08/30/2018 Document Reviewed: 08/13/2017 Elsevier Patient Education  2020 [employer].

## 2019-09-13 NOTE — Progress Notes (Signed)
Subjective  CC:  Chief Complaint  Patient presents with  . Bilateral leg pain    started 5 days ago. Both feet felt tight and freezing cold  . Numbness    ankles down   Same day acute visit; PCP not available. New pt to me. Chart reviewed.   HPI: Laura Maldonado is a 54 y.o. female who presents to the office today to address the problems listed above in the chief complaint.  I reviewed recent mychart note documenting her muscle cramp and paresthesias. She presents to day with almost one week h/o numbness and tingling in bilateral feet; both feet "feel cold on the inside" although they are warm to the touch. This is a new problem. No h/o peripheral neuropathy. She does have MS; most recent brain MRI from January reviewed and was stable. She reports some paresthesias as well in her right had associated with wrist pain but that lasted just one day and resolved spontaneously. No weakness or pain. No back pain. She reports left thigh pain last year treated with pred due to suspected dx of lumbar radiculopathy. She is not having leg pain now. She questions if steroid should be used again for these new sxs.   As well, she had a painful nocturnal calf muscle cramp last week. Not recurrent. It was on the left.   I reviewed labs from august: elevate vit b12, nl vit D, (on supplements), nl tsh and cbc.   Assessment  1. Paresthesia of both feet   2. Multiple sclerosis ([place])   3. Muscle cramp, nocturnal   4. History of radicular syndrome of lower limb   5. Vitamin D deficiency      Plan   Paressthesias, feet:  Sounds like new peripheral neuropathy. Given bilateral sxs, less likely related to lumbar radiculopathy or MS; nl exam today. Start with lab eval and then monitoring sxs. Pt cannot take gabapentin. Will follow up with primary care if persists for further evaluation. If other neuro sxs develop, then will need to see her neurologist to ensure not related to MS flare. Patient understands  and agrees with care plan.   rec stretching and hydration for mm cramp prevention.   I do not recommend steroids at this time  Follow up: No follow-ups on file.  Visit date not found  Orders Placed This Encounter  Procedures  . B12 and Folate Panel  . VITAMIN D 25 Hydroxy (Vit-D Deficiency, Fractures)  . Comprehensive metabolic panel  . TSH  . CBC with Differential/Platelet  . Sedimentation rate   No orders of the defined types were placed in this encounter.     I reviewed the patients updated PMH, FH, and SocHx.    Patient Active Problem List   Diagnosis Date Noted  . Melanoma of skin ([place]) 08/22/2019  . Diverticulosis 08/22/2019  . Chronic nonintractable headache 03/08/2017  . Moderate episode of recurrent major depressive disorder ([place]) 03/08/2017  . Obesity (BMI 30-39.9) 01/15/2017  . Vitamin D deficiency 05/29/2016  . Pure hypercholesterolemia 05/29/2016  . Multiple sclerosis ([place]) 05/29/2016   Current Meds  Medication Sig  . ALPRAZolam (XANAX) 0.5 MG tablet Take 1 tablet (0.5 mg total) by mouth daily as needed for anxiety (for MRI).  . B Complex Vitamins (B COMPLEX 100 PO) Take 1 capsule by mouth daily.  . baclofen (LIORESAL) 10 MG tablet Take 1 tablet (10 mg total) by mouth 3 (three) times daily.  . Calcium Carb-Cholecalciferol (CALCIUM 1000 + D PO) Take  1 tablet by mouth daily.  . Digestive Enzyme CAPS Take by mouth. Every meal  . JUBLIA 10 % SOLN   . Magnesium 500 MG TABS Take by mouth.  Marland Kitchen olopatadine (PATADAY) 0.1 % ophthalmic solution Place 1 drop into both eyes as needed.  . Probiotic Product (PROBIOTIC MATURE ADULT) CAPS Take by mouth.  . topiramate (TOPAMAX) 25 MG tablet TAKE 1 TABLET BY MOUTH TWICE A DAY  . Turmeric 500 MG CAPS Take by mouth.    Allergies: Patient is allergic to bee venom; shellfish allergy; interferons; acetaminophen; codeine; flagyl [metronidazole]; gabapentin; gluten meal; lactose; lamotrigine; latex; and oseltamivir. Family  History: Patient family history includes Heart disease in her father; Hypertension in her maternal grandfather, maternal grandmother, mother, paternal grandfather, and paternal grandmother. Social History:  Patient  reports that she has never smoked. She has never used smokeless tobacco. She reports current alcohol use. She reports that she does not use drugs.  Review of Systems: Constitutional: Negative for fever malaise or anorexia Cardiovascular: negative for chest pain Respiratory: negative for SOB or persistent cough Gastrointestinal: negative for abdominal pain  Objective  Vitals: BP 122/84 (BP Location: Left Arm, Patient Position: Sitting, Cuff Size: Large)   Pulse 65   Temp (!) 97.3 F (36.3 C) (Temporal)   Ht 5\' 6"  (1.676 m)   Wt 225 lb 3.2 oz (102.2 kg)   LMP  (LMP Unknown)   SpO2 99%   BMI 36.35 kg/m  General: no acute distress , A&Ox3 Ext: nl appearing feet, warm, no discoloration. No pitting edema. Nl pedal pulses.     Commons side effects, risks, benefits, and alternatives for medications and treatment plan prescribed today were discussed, and the patient expressed understanding of the given instructions. Patient is instructed to call or message via MyChart if he/she has any questions or concerns regarding our treatment plan. No barriers to understanding were identified. We discussed Red Flag symptoms and signs in detail. Patient expressed understanding regarding what to do in case of urgent or emergency type symptoms.   Medication list was reconciled, printed and provided to the patient in AVS. Patient instructions and summary information was reviewed with the patient as documented in the AVS. This note was prepared with assistance of Dragon voice recognition software. Occasional wrong-word or sound-a-like substitutions may have occurred due to the inherent limitations of voice recognition software  This visit occurred during the SARS-CoV-2 public health emergency.   Safety protocols were in place, including screening questions prior to the visit, additional usage of staff PPE, and extensive cleaning of exam room while observing appropriate contact time as indicated for disinfecting solutions.

## 2019-10-03 ENCOUNTER — Encounter: Payer: Self-pay | Admitting: Family Medicine

## 2019-10-20 ENCOUNTER — Other Ambulatory Visit: Payer: Self-pay | Admitting: Family Medicine

## 2019-10-20 DIAGNOSIS — Z1231 Encounter for screening mammogram for malignant neoplasm of breast: Secondary | ICD-10-CM

## 2019-11-07 ENCOUNTER — Encounter: Payer: Self-pay | Admitting: Family Medicine

## 2019-11-07 ENCOUNTER — Ambulatory Visit: Payer: 59 | Admitting: Family Medicine

## 2019-11-07 ENCOUNTER — Ambulatory Visit (INDEPENDENT_AMBULATORY_CARE_PROVIDER_SITE_OTHER): Payer: 59 | Admitting: Family Medicine

## 2019-11-07 ENCOUNTER — Other Ambulatory Visit: Payer: Self-pay

## 2019-11-07 VITALS — BP 118/76 | HR 73 | Temp 96.6°F | Ht 66.0 in | Wt 225.8 lb

## 2019-11-07 DIAGNOSIS — G35 Multiple sclerosis: Secondary | ICD-10-CM | POA: Diagnosis not present

## 2019-11-07 NOTE — Progress Notes (Signed)
Patient: Laura Maldonado MRN: [id number] DOB: 1965-09-30 PCP: Orma Flaming, MD     Subjective:  Chief Complaint  Patient presents with  . Foot Pain    Follow up with Dr Jonni Sanger    HPI: The patient is a 54 y.o. female who presents today for a follow up of Paresthesia of both feet. Pt says that the pain has decreased. She saw Dr. Jonni Sanger on 09/13/2019 and was told likely peripheral neuropathy. She went and saw Neurologist at [place] who thought symptoms were due to a MS flair. Given steroids and baclofen which has helped her. No emg's were done. Neurologist didn't say much regarding this feeling.   She still has a band type feeling around her right ankle and has really decreased her mobility. She feel like she has brace around her right ankle. She states the steroids helped, but the feeling never went away. It just lessened. She is able to shower now by herself. Slolwy gaining more normal sensation back and more ambulation. She also feels like she has increased swelling in her legs, although her husband does not appreciate this.   Brought all of her MRIs with her to scan into chart.  Most recent was 10/03/19. She had brain and cervical MRIs done that showed 2 new FLAIR hyperintense lesions in brain identified. 1 new T21 hyperintense lesion found posteriorly within the cervical spinal cord at c6 level. New disc protrusion at c5-c6 with resulting mild canal narrowing.   Review of Systems  Constitutional: Positive for fatigue. Negative for chills and fever.  HENT: Negative for sinus pressure, sinus pain and sore throat.   Respiratory: Negative for cough and shortness of breath.   Cardiovascular: Negative for chest pain and palpitations.  Gastrointestinal: Negative for abdominal pain, nausea and vomiting.  Genitourinary: Negative for frequency, pelvic pain and urgency.  Musculoskeletal: Positive for myalgias (right foot ).  Allergic/Immunologic: Positive for environmental allergies.  Neurological:  Positive for dizziness, weakness (right foot ) and light-headedness.       2 months     Allergies Patient is allergic to bee venom; shellfish allergy; interferons; acetaminophen; codeine; flagyl [metronidazole]; gabapentin; gluten meal; lactose; lamotrigine; latex; and oseltamivir.  Past Medical History Patient  has a past medical history of Allergy, Anxiety, Cancer ([place]), Chicken pox, Diverticulitis, Frequent headaches, GERD (gastroesophageal reflux disease), Heart murmur, Migraines, Multiple sclerosis ([place]) (05/29/2016), Neuromuscular disorder ([place]), Obesity (BMI 30-39.9) (01/15/2017), Pure hypercholesterolemia (05/29/2016), and Vitamin D deficiency (05/29/2016).  Surgical History Patient  has a past surgical history that includes Cholecystectomy; Abdominal hysterectomy; Melanoma excision (1986); Mole removal; Colonoscopy; Upper gastrointestinal endoscopy; Cesarean section; and Breast excisional biopsy (Left, 2007).  Family History Pateint's family history includes Heart disease in her father; Hypertension in her maternal grandfather, maternal grandmother, mother, paternal grandfather, and paternal grandmother.  Social History Patient  reports that she has never smoked. She has never used smokeless tobacco. She reports current alcohol use. She reports that she does not use drugs.    Objective: Vitals:   11/07/19 0926  BP: 118/76  Pulse: 73  Temp: (!) 96.6 F (35.9 C)  TempSrc: Temporal  SpO2: 99%  Weight: 225 lb 12.8 oz (102.4 kg)  Height: 5\' 6"  (1.676 m)    Body mass index is 36.45 kg/m.  Physical Exam Vitals reviewed.  Constitutional:      Appearance: Normal appearance. She is obese.  HENT:     Head: Normocephalic and atraumatic.  Cardiovascular:     Rate and Rhythm: Normal rate and regular  rhythm.     Heart sounds: Normal heart sounds.  Pulmonary:     Effort: Pulmonary effort is normal.     Breath sounds: Normal breath sounds.  Abdominal:     General: Abdomen is  flat. Bowel sounds are normal.     Palpations: Abdomen is soft.  Musculoskeletal:     Comments: Very minimal non pitting edema in lower legs above ankle.   Neurological:     General: No focal deficit present.     Mental Status: She is alert and oriented to person, place, and time.     Sensory: No sensory deficit.     Comments: Right lower leg: no sensory deficit. Strength actually good in right and left leg bilaterally 4-5/5. She does have a slow and slight limp to her gait. No hyperactive DTR in legs.   Psychiatric:        Mood and Affect: Mood normal.        Behavior: Behavior normal.    MRis reviewed and scanned into chart. Note from neurologist also read and scanned.     Assessment/plan: 1. Multiple sclerosis ([place]) I do agree with neurologist and think this is a flair of her MS. Gratefully it is improving. Did discuss may not ever get back to "normal."  Discussed time and patience. I think her leg swelling is from her being more sedentary with the flair. Recommended compression hose, leg elevation and exercise as tolerated as she is able to do more. Will see if she does any treatment for MS. Records will be scanned. She is so lovely and may be moving back to [place]. F/u with for annual before leaving or as needed.    Total time of encounter: 38 minutes total time of encounter, including 30 minutes spent in face-to-face patient care. This time includes coordination of care and counseling regarding symptoms, MS, course. Remainder of non-face-to-face time involved reviewing chart documents/testing relevant to the patient encounter and documentation in the medical record.  This visit occurred during the SARS-CoV-2 public health emergency.  Safety protocols were in place, including screening questions prior to the visit, additional usage of staff PPE, and extensive cleaning of exam room while observing appropriate contact time as indicated for disinfecting solutions.     Return if symptoms  worsen or fail to improve.   Orma Flaming, MD [place]   11/07/2019

## 2019-11-22 ENCOUNTER — Ambulatory Visit: Payer: 59

## 2019-12-12 ENCOUNTER — Ambulatory Visit: Payer: 59 | Admitting: Family Medicine

## 2019-12-28 ENCOUNTER — Other Ambulatory Visit: Payer: Self-pay

## 2019-12-28 ENCOUNTER — Encounter: Payer: Self-pay | Admitting: Family Medicine

## 2019-12-28 ENCOUNTER — Telehealth: Payer: Self-pay

## 2019-12-28 ENCOUNTER — Ambulatory Visit (INDEPENDENT_AMBULATORY_CARE_PROVIDER_SITE_OTHER): Payer: 59 | Admitting: Family Medicine

## 2019-12-28 VITALS — BP 122/80 | HR 71 | Temp 97.6°F | Ht 66.0 in | Wt 224.6 lb

## 2019-12-28 DIAGNOSIS — H539 Unspecified visual disturbance: Secondary | ICD-10-CM

## 2019-12-28 DIAGNOSIS — B029 Zoster without complications: Secondary | ICD-10-CM

## 2019-12-28 MED ORDER — VALACYCLOVIR HCL 1 G PO TABS: 1000.0000 mg | ORAL_TABLET | Freq: Three times a day (TID) | ORAL | 0 refills | Status: DC

## 2019-12-28 NOTE — Telephone Encounter (Signed)
Patient is scheduled to see Dr. Nori Riis [facility] today. Provider is requesting to fax notes faxed over today before 12:30pm.  The fax number is [phone]

## 2019-12-28 NOTE — Telephone Encounter (Signed)
Fax sent.

## 2019-12-28 NOTE — Progress Notes (Signed)
Patient: Laura Maldonado MRN: [id number] DOB: 01-10-1966 PCP: Orma Flaming, MD     Subjective:  Chief Complaint  Patient presents with  . Rash    right sided chest  . Eye Problem    Pt says that she feels as if she is going to fall.    HPI: The patient is a 54 y.o. female who presents today for rash located on the right side of her chest. Patient says that it appeared about a week ago. She has no idea. It felt sensitive at first when her hair touched it. She has put cortisone on it and this has helped, but it's not going away. She thinks it may have spread just a little bit. It's long in nature. No blisters or scabs. No pain, no itching. No where else on body. She has a sensitive feeling before rash broke out.    She also has some concerns about vision. She says when she walks she sees things that appear slick or wet. She is not having double vision, pain in her eyes. She is unsure if having a MS flair. She has an eye doctor she can see.    Review of Systems  Eyes: Positive for visual disturbance.  Respiratory: Negative for cough, shortness of breath and wheezing.   Cardiovascular: Negative for chest pain and palpitations.  Gastrointestinal: Negative for abdominal pain, nausea and vomiting.  Neurological: Negative for dizziness, light-headedness and headaches.    Allergies Patient is allergic to bee venom; shellfish allergy; interferons; acetaminophen; codeine; flagyl [metronidazole]; gabapentin; gluten meal; lactose; lamotrigine; latex; and oseltamivir.  Past Medical History Patient  has a past medical history of Allergy, Anxiety, Cancer ([place]), Chicken pox, Diverticulitis, Frequent headaches, GERD (gastroesophageal reflux disease), Heart murmur, Migraines, Multiple sclerosis ([place]) (05/29/2016), Neuromuscular disorder ([place]), Obesity (BMI 30-39.9) (01/15/2017), Pure hypercholesterolemia (05/29/2016), and Vitamin D deficiency (05/29/2016).  Surgical History Patient  has a past  surgical history that includes Cholecystectomy; Abdominal hysterectomy; Melanoma excision (1986); Mole removal; Colonoscopy; Upper gastrointestinal endoscopy; Cesarean section; and Breast excisional biopsy (Left, 2007).  Family History Pateint's family history includes Heart disease in her father; Hypertension in her maternal grandfather, maternal grandmother, mother, paternal grandfather, and paternal grandmother.  Social History Patient  reports that she has never smoked. She has never used smokeless tobacco. She reports current alcohol use. She reports that she does not use drugs.    Objective: Vitals:   12/28/19 0850  BP: 122/80  Pulse: 71  Temp: 97.6 F (36.4 C)  TempSrc: Temporal  SpO2: 97%  Weight: 224 lb 9.6 oz (101.9 kg)  Height: 5\' 6"  (1.676 m)    Body mass index is 36.25 kg/m.  Physical Exam Vitals reviewed.  Constitutional:      Appearance: Normal appearance. She is obese.  HENT:     Head: Normocephalic and atraumatic.  Eyes:     Extraocular Movements: Extraocular movements intact.     Pupils: Pupils are equal, round, and reactive to light.  Skin:    Findings: Rash (right upper chest erythematous with raised blisters with one scab in dermatomal pattern) present.  Neurological:     General: No focal deficit present.     Mental Status: She is alert and oriented to person, place, and time.     Cranial Nerves: No cranial nerve deficit.        Assessment/plan: 1. Herpes zoster without complication Valacyclovir TID x 7 days. Handout given.   2. Vision changes Advised she call her eye doctor today and  get in for exam. Do not think she has shingles in her eye, but concern for MS flair and optic neuritis needs to be ruled out. She will call eye doctor and let me know if she can not been seen today.     This visit occurred during the SARS-CoV-2 public health emergency.  Safety protocols were in place, including screening questions prior to the visit, additional  usage of staff PPE, and extensive cleaning of exam room while observing appropriate contact time as indicated for disinfecting solutions.     No follow-ups on file.   Orma Flaming, MD [place]   12/28/2019

## 2019-12-28 NOTE — Patient Instructions (Addendum)
Call eye doctor today! Need seen today to make sure no optic neuritis from MS.   Shingles  Shingles is an infection. It gives you a painful skin rash and blisters that have fluid in them. Shingles is caused by the same germ (virus) that causes chickenpox. Shingles only happens in people who:  Have had chickenpox.  Have been given a shot of medicine (vaccine) to protect against chickenpox. Shingles is rare in this group. The first symptoms of shingles may be itching, tingling, or pain in an area on your skin. A rash will show on your skin a few days or weeks later. The rash is likely to be on one side of your body. The rash usually has a shape like a belt or a band. Over time, the rash turns into fluid-filled blisters. The blisters will break open, change into scabs, and dry up. Medicines may:  Help with pain and itching.  Help you get better sooner.  Help to prevent long-term problems. Follow these instructions at home: Medicines  Take over-the-counter and prescription medicines only as told by your doctor.  Put on an anti-itch cream or numbing cream where you have a rash, blisters, or scabs. Do this as told by your doctor. Helping with itching and discomfort   Put cold, wet cloths (cold compresses) on the area of the rash or blisters as told by your doctor.  Cool baths can help you feel better. Try adding baking soda or dry oatmeal to the water to lessen itching. Do not bathe in hot water. Blister and rash care  Keep your rash covered with a loose bandage (dressing).  Wear loose clothing that does not rub on your rash.  Keep your rash and blisters clean. To do this, wash the area with mild soap and cool water as told by your doctor.  Check your rash every day for signs of infection. Check for: ? More redness, swelling, or pain. ? Fluid or blood. ? Warmth. ? Pus or a bad smell.  Do not scratch your rash. Do not pick at your blisters. To help you to not scratch: ? Keep your  fingernails clean and cut short. ? Wear gloves or mittens when you sleep, if scratching is a problem. General instructions  Rest as told by your doctor.  Keep all follow-up visits as told by your doctor. This is important.  Wash your hands often with soap and water. If soap and water are not available, use hand sanitizer. Doing this lowers your chance of getting a skin infection caused by germs (bacteria).  Your infection can cause chickenpox in people who have never had chickenpox or never got a shot of chickenpox vaccine. If you have blisters that did not change into scabs yet, try not to touch other people or be around other people, especially: ? Babies. ? Pregnant women. ? Children who have areas of red, itchy, or rough skin (eczema). ? Very old people who have transplants. ? People who have a long-term (chronic) sickness, like cancer or AIDS. Contact a doctor if:  Your pain does not get better with medicine.  Your pain does not get better after the rash heals.  You have any signs of infection in the rash area. These signs include: ? More redness, swelling, or pain around the rash. ? Fluid or blood coming from the rash. ? The rash area feeling warm to the touch. ? Pus or a bad smell coming from the rash. Get help right away if:  The  rash is on your face or nose.  You have pain in your face or pain by your eye.  You lose feeling on one side of your face.  You have trouble seeing.  You have ear pain, or you have ringing in your ear.  You have a loss of taste.  Your condition gets worse. Summary  Shingles gives you a painful skin rash and blisters that have fluid in them.  Shingles is an infection. It is caused by the same germ (virus) that causes chickenpox.  Keep your rash covered with a loose bandage (dressing). Wear loose clothing that does not rub on your rash.  If you have blisters that did not change into scabs yet, try not to touch other people or be around  people. This information is not intended to replace advice given to you by your health care provider. Make sure you discuss any questions you have with your health care provider. Document Revised: 11/26/2018 Document Reviewed: 04/08/2017 Elsevier Patient Education  2020 [employer].

## 2020-01-01 ENCOUNTER — Other Ambulatory Visit: Payer: Self-pay | Admitting: Family Medicine

## 2020-01-01 DIAGNOSIS — G8929 Other chronic pain: Secondary | ICD-10-CM

## 2020-01-01 DIAGNOSIS — R519 Headache, unspecified: Secondary | ICD-10-CM

## 2020-01-02 NOTE — Telephone Encounter (Signed)
Okay to refill Topamax?

## 2020-01-02 NOTE — Telephone Encounter (Signed)
Please review

## 2020-02-01 ENCOUNTER — Ambulatory Visit
Admission: RE | Admit: 2020-02-01 | Discharge: 2020-02-01 | Disposition: A | Discharge status: Home / Self Care | Payer: 59 | Source: Ambulatory Visit | Attending: Family Medicine | Admitting: Family Medicine

## 2020-02-01 ENCOUNTER — Other Ambulatory Visit: Payer: Self-pay

## 2020-02-01 DIAGNOSIS — Z1231 Encounter for screening mammogram for malignant neoplasm of breast: Secondary | ICD-10-CM

## 2020-03-22 ENCOUNTER — Encounter: Payer: Self-pay | Admitting: Family Medicine

## 2020-03-22 ENCOUNTER — Ambulatory Visit (INDEPENDENT_AMBULATORY_CARE_PROVIDER_SITE_OTHER): Payer: 59 | Admitting: Family Medicine

## 2020-03-22 ENCOUNTER — Other Ambulatory Visit: Payer: Self-pay

## 2020-03-22 VITALS — BP 124/80 | HR 64 | Temp 97.5°F | Ht 66.0 in | Wt 222.4 lb

## 2020-03-22 DIAGNOSIS — E7800 Pure hypercholesterolemia, unspecified: Secondary | ICD-10-CM | POA: Diagnosis not present

## 2020-03-22 DIAGNOSIS — Z1159 Encounter for screening for other viral diseases: Secondary | ICD-10-CM

## 2020-03-22 DIAGNOSIS — Z0000 Encounter for general adult medical examination without abnormal findings: Secondary | ICD-10-CM | POA: Diagnosis not present

## 2020-03-22 DIAGNOSIS — R768 Other specified abnormal immunological findings in serum: Secondary | ICD-10-CM | POA: Insufficient documentation

## 2020-03-22 LAB — COMPREHENSIVE METABOLIC PANEL
AG Ratio: 1.5 (calc) (ref 1.0–2.5)
ALT: 22 U/L (ref 6–29)
AST: 22 U/L (ref 10–35)
Albumin: 4.3 g/dL (ref 3.6–5.1)
Alkaline phosphatase (APISO): 77 U/L (ref 37–153)
BUN: 16 mg/dL (ref 7–25)
CO2: 26 mmol/L (ref 20–32)
Calcium: 9.6 mg/dL (ref 8.6–10.4)
Chloride: 103 mmol/L (ref 98–110)
Creat: 0.68 mg/dL (ref 0.50–1.05)
Globulin: 2.9 g/dL (calc) (ref 1.9–3.7)
Glucose, Bld: 91 mg/dL (ref 65–99)
Potassium: 4.3 mmol/L (ref 3.5–5.3)
Sodium: 138 mmol/L (ref 135–146)
Total Bilirubin: 0.4 mg/dL (ref 0.2–1.2)
Total Protein: 7.2 g/dL (ref 6.1–8.1)

## 2020-03-22 LAB — TSH: TSH: 1.49 mIU/L

## 2020-03-22 NOTE — Progress Notes (Signed)
Patient: Laura Maldonado MRN: [id number] DOB: 12/31/1965 PCP: Orma Flaming, MD     Subjective:  Chief Complaint  Patient presents with  . Annual Exam    HPI: The patient is a 54 y.o. female who presents today for annual exam. She denies any changes to past medical history. There have been no recent hospitalizations. She is following a well balanced diet and exercise plan. She uses her exercise bike, and walking when weather permits. Weight has been stable. No complaints today.   Immunization History  Administered Date(s) Administered  . Influenza,inj,Quad PF,6+ Mos 05/08/2019  . PFIZER SARS-COV-2 Vaccination 11/05/2019, 11/26/2019  . Tdap 06/13/2013   Colonoscopy: Mammogram:  Pap smear:  PSA:   Review of Systems  Constitutional: Negative for chills, fatigue and fever.  HENT: Negative for dental problem, ear pain, hearing loss and trouble swallowing.   Eyes: Negative for visual disturbance.  Respiratory: Negative for cough, chest tightness, shortness of breath and wheezing.   Cardiovascular: Negative for chest pain, palpitations and leg swelling.  Gastrointestinal: Negative for abdominal pain, blood in stool, diarrhea and nausea.  Endocrine: Negative for cold intolerance, polydipsia, polyphagia and polyuria.  Genitourinary: Negative for dysuria and hematuria.  Musculoskeletal: Negative for arthralgias.  Skin: Negative for rash.  Neurological: Positive for headaches. Negative for dizziness.  Psychiatric/Behavioral: Negative for dysphoric mood and sleep disturbance. The patient is not nervous/anxious.     Allergies Patient is allergic to bee venom, shellfish allergy, interferons, acetaminophen, codeine, flagyl [metronidazole], gabapentin, gluten meal, lactose, lamotrigine, latex, and oseltamivir.  Past Medical History Patient  has a past medical history of Allergy, Anxiety, Cancer ([place]), Chicken pox, Diverticulitis, Frequent headaches, GERD (gastroesophageal reflux  disease), Heart murmur, Migraines, Multiple sclerosis ([place]) (05/29/2016), Neuromuscular disorder ([place]), Obesity (BMI 30-39.9) (01/15/2017), Pure hypercholesterolemia (05/29/2016), and Vitamin D deficiency (05/29/2016).  Surgical History Patient  has a past surgical history that includes Cholecystectomy; Abdominal hysterectomy; Melanoma excision (1986); Mole removal; Colonoscopy; Upper gastrointestinal endoscopy; Cesarean section; and Breast excisional biopsy (Left, 2007).  Family History Pateint's family history includes Heart disease in her father; Hypertension in her maternal grandfather, maternal grandmother, mother, paternal grandfather, and paternal grandmother.  Social History Patient  reports that she has never smoked. She has never used smokeless tobacco. She reports current alcohol use. She reports that she does not use drugs.    Objective: Vitals:   03/22/20 0828  BP: 124/80  Pulse: 64  Temp: (!) 97.5 F (36.4 C)  TempSrc: Temporal  SpO2: 100%  Weight: 222 lb 6.4 oz (100.9 kg)  Height: 5\' 6"  (1.676 m)    Body mass index is 35.9 kg/m.  Physical Exam Vitals reviewed.  Constitutional:      Appearance: Normal appearance. She is obese.  HENT:     Head: Normocephalic and atraumatic.     Right Ear: Tympanic membrane, ear canal and external ear normal.     Left Ear: Tympanic membrane, ear canal and external ear normal.     Nose: Nose normal.     Mouth/Throat:     Mouth: Mucous membranes are moist.  Eyes:     Extraocular Movements: Extraocular movements intact.     Pupils: Pupils are equal, round, and reactive to light.  Neck:     Vascular: No carotid bruit.  Cardiovascular:     Rate and Rhythm: Normal rate and regular rhythm.     Heart sounds: Normal heart sounds.  Pulmonary:     Effort: Pulmonary effort is normal.     Breath sounds:  Normal breath sounds.  Abdominal:     General: Bowel sounds are normal.     Palpations: Abdomen is soft.  Musculoskeletal:      Cervical back: Normal range of motion and neck supple.  Skin:    General: Skin is warm.     Capillary Refill: Capillary refill takes less than 2 seconds.  Neurological:     General: No focal deficit present.     Mental Status: She is alert and oriented to person, place, and time.  Psychiatric:        Mood and Affect: Mood normal.        Behavior: Behavior normal.        Office Visit from 03/22/2020 in [facility]  PHQ-9 Total Score 2         Assessment/plan:   1. Annual physical exam utd on her HM and reviewed today. She is overall doing well. May be moving to [place] soon. Encouraged exercise and healthy eating. F/u in one year or as needed.  Patient counseling [x]    Nutrition: Stressed importance of moderation in sodium/caffeine intake, saturated fat and cholesterol, caloric balance, sufficient intake of fresh fruits, vegetables, fiber, calcium, iron, and 1 mg of folate supplement per day (for females capable of pregnancy).  [x]    Stressed the importance of regular exercise.   []    Substance Abuse: Discussed cessation/primary prevention of tobacco, alcohol, or other drug use; driving or other dangerous activities under the influence; availability of treatment for abuse.   [x]    Injury prevention: Discussed safety belts, safety helmets, smoke detector, smoking near bedding or upholstery.   [x]    Sexuality: Discussed sexually transmitted diseases, partner selection, use of condoms, avoidance of unintended pregnancy  and contraceptive alternatives.  [x]    Dental health: Discussed importance of regular tooth brushing, flossing, and dental visits.  [x]    Health maintenance and immunizations reviewed. Please refer to Health maintenance section.    - Type and screen; Future - CBC with Differential/Platelet; Future - Comprehensive metabolic panel; Future - Lipid panel; Future - TSH; Future  2. Pure hypercholesterolemia Checking lipid panel. Does not desire  statin.   This visit occurred during the SARS-CoV-2 public health emergency.  Safety protocols were in place, including screening questions prior to the visit, additional usage of staff PPE, and extensive cleaning of exam room while observing appropriate contact time as indicated for disinfecting solutions.     Return in about 1 year (around 03/22/2021).     Orma Flaming, MD [place]  03/22/2020

## 2020-03-22 NOTE — Patient Instructions (Addendum)

## 2020-03-23 LAB — CBC WITH DIFFERENTIAL/PLATELET
Absolute Monocytes: 390 cells/uL (ref 200–950)
Basophils Absolute: 61 cells/uL (ref 0–200)
Basophils Relative: 1 %
Eosinophils Absolute: 61 cells/uL (ref 15–500)
Eosinophils Relative: 1 %
HCT: 42 % (ref 35.0–45.0)
Hemoglobin: 14 g/dL (ref 11.7–15.5)
Lymphs Abs: 1922 cells/uL (ref 850–3900)
MCH: 31.6 pg (ref 27.0–33.0)
MCHC: 33.3 g/dL (ref 32.0–36.0)
MCV: 94.8 fL (ref 80.0–100.0)
MPV: 11 fL (ref 7.5–12.5)
Monocytes Relative: 6.4 %
Neutro Abs: 3666 cells/uL (ref 1500–7800)
Neutrophils Relative %: 60.1 %
Platelets: 309 10*3/uL (ref 140–400)
RBC: 4.43 10*6/uL (ref 3.80–5.10)
RDW: 12.6 % (ref 11.0–15.0)
Total Lymphocyte: 31.5 %
WBC: 6.1 10*3/uL (ref 3.8–10.8)

## 2020-03-23 LAB — HEPATITIS C ANTIBODY
Hepatitis C Ab: NONREACTIVE
SIGNAL TO CUT-OFF: 0.01 (ref <1.00)

## 2020-03-23 LAB — LIPID PANEL
Cholesterol: 235 mg/dL — ABNORMAL HIGH (ref <200)
HDL: 71 mg/dL (ref >=50)
LDL Cholesterol (Calc): 145 mg/dL (calc) — ABNORMAL HIGH
Non-HDL Cholesterol (Calc): 164 mg/dL (calc) — ABNORMAL HIGH (ref <130)
Total CHOL/HDL Ratio: 3.3 (calc) (ref <5.0)
Triglycerides: 85 mg/dL (ref <150)

## 2020-03-25 ENCOUNTER — Encounter: Payer: Self-pay | Admitting: Family Medicine

## 2020-04-03 ENCOUNTER — Other Ambulatory Visit: Payer: Self-pay

## 2020-04-03 ENCOUNTER — Other Ambulatory Visit: Payer: 59

## 2020-04-03 DIAGNOSIS — Z0183 Encounter for blood typing: Secondary | ICD-10-CM

## 2020-04-03 NOTE — Telephone Encounter (Signed)
I spoke with the pt to address concerns. Pt needs to come in to have labs "Type and Screen" redrawn to send to [facility] lab for testing.

## 2020-04-04 LAB — ABO AND RH

## 2020-04-06 ENCOUNTER — Encounter: Payer: 59 | Admitting: Family Medicine

## 2020-05-31 ENCOUNTER — Encounter: Payer: Self-pay | Admitting: Family Medicine

## 2020-05-31 ENCOUNTER — Other Ambulatory Visit: Payer: Self-pay

## 2020-05-31 ENCOUNTER — Ambulatory Visit (INDEPENDENT_AMBULATORY_CARE_PROVIDER_SITE_OTHER): Payer: 59 | Admitting: Family Medicine

## 2020-05-31 VITALS — BP 112/68 | HR 66 | Temp 98.0°F | Ht 66.0 in | Wt 222.0 lb

## 2020-05-31 DIAGNOSIS — G509 Disorder of trigeminal nerve, unspecified: Secondary | ICD-10-CM | POA: Diagnosis not present

## 2020-05-31 MED ORDER — PREDNISONE 20 MG PO TABS: 40.0000 mg | ORAL_TABLET | Freq: Every day | ORAL | 0 refills | Status: DC

## 2020-05-31 NOTE — Patient Instructions (Signed)
-  short course of steroids for 5 days to see if it calms down possible inflammation of your trigeminal nerve.  -please keep me posted because this is clinical and I need to make sure no new symptoms develop.    Tell chuck hi!  Aw

## 2020-05-31 NOTE — Progress Notes (Signed)
Patient: Laura Maldonado MRN: [id number] DOB: 1966/08/05 PCP: Orma Flaming, MD     Subjective:  Chief Complaint  Patient presents with  . Ear Pain    HPI: The patient is a 54 y.o. female who presents today for left sided ear pain. Starting with soreness in her left jaw 2 weeks ago. Last 3-4 days she has experienced random shooting pain, that is hard to describe. She says that when drinking it burns when she swallows. She denies any fever or nausea. She has taken Advil to help ease the pain.  She had a dentist appointment last week of September 9/28 and after this she started to have pain right in front of her left ear at angle of mandible. It was sore to touch and she massaged it. She wears a night guard and has had some stiffness in her neck on this side. She states it now has turned into intermittent ear pain. Sometimes the ear pain can be temporal area. She was drinking something warm and felt burning in the back of her jaw and down the left side of the throat. She does have some soreness with opening and closing the jaw. No popping sensation. No pain with chewing.   Review of Systems  Constitutional: Negative for fever.  HENT: Positive for ear pain. Negative for ear discharge, facial swelling and hearing loss.   Neurological: Negative for dizziness and headaches.    Allergies Patient is allergic to bee venom, shellfish allergy, interferons, acetaminophen, codeine, flagyl [metronidazole], gabapentin, gluten meal, lactose, lamotrigine, latex, and oseltamivir.  Past Medical History Patient  has a past medical history of Allergy, Anxiety, Cancer ([place]), Chicken pox, Diverticulitis, Frequent headaches, GERD (gastroesophageal reflux disease), Heart murmur, Migraines, Multiple sclerosis ([place]) (05/29/2016), Neuromuscular disorder ([place]), Obesity (BMI 30-39.9) (01/15/2017), Pure hypercholesterolemia (05/29/2016), and Vitamin D deficiency (05/29/2016).  Surgical History Patient  has a past  surgical history that includes Cholecystectomy; Abdominal hysterectomy; Melanoma excision (1986); Mole removal; Colonoscopy; Upper gastrointestinal endoscopy; Cesarean section; and Breast excisional biopsy (Left, 2007).  Family History Pateint's family history includes Heart disease in her father; Hypertension in her maternal grandfather, maternal grandmother, mother, paternal grandfather, and paternal grandmother.  Social History Patient  reports that she has never smoked. She has never used smokeless tobacco. She reports current alcohol use. She reports that she does not use drugs.    Objective: Vitals:   05/31/20 1144  BP: 112/68  Pulse: 66  Temp: 98 F (36.7 C)  TempSrc: Temporal  SpO2: 97%  Weight: 222 lb (100.7 kg)  Height: 5\' 6"  (1.676 m)    Body mass index is 35.83 kg/m.  Physical Exam Vitals reviewed.  Constitutional:      Appearance: Normal appearance. She is obese.  HENT:     Head: Normocephalic and atraumatic.     Comments: Mild ttp over right maxillary sinus. ttp just inferior to left ear at angle of mandible. No clicking of tmj. No pain or edema of joint.     Right Ear: Tympanic membrane, ear canal and external ear normal.     Left Ear: Tympanic membrane, ear canal and external ear normal.     Nose: Nose normal.     Mouth/Throat:     Mouth: Mucous membranes are moist.  Eyes:     Extraocular Movements: Extraocular movements intact.     Conjunctiva/sclera: Conjunctivae normal.     Pupils: Pupils are equal, round, and reactive to light.  Cardiovascular:     Rate and Rhythm: Normal rate  and regular rhythm.     Heart sounds: Normal heart sounds.  Pulmonary:     Effort: Pulmonary effort is normal.     Breath sounds: Normal breath sounds.  Musculoskeletal:     Cervical back: Normal range of motion and neck supple.  Lymphadenopathy:     Cervical: No cervical adenopathy.  Skin:    Capillary Refill: Capillary refill takes less than 2 seconds.  Neurological:      General: No focal deficit present.     Mental Status: She is alert and oriented to person, place, and time.        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 Irritated trigeminal nerve from dental work vs ms? Steroid burst and want her to keep a journal of her symptoms. Keep me posted.    This visit occurred during the SARS-CoV-2 public health emergency.  Safety protocols were in place, including screening questions prior to the visit, additional usage of staff PPE, and extensive cleaning of exam room while observing appropriate contact time as indicated for disinfecting solutions.     Return if symptoms worsen or fail to improve.    Orma Flaming, MD [place]   05/31/2020

## 2020-06-04 ENCOUNTER — Encounter: Payer: Self-pay | Admitting: Family Medicine

## 2020-07-18 ENCOUNTER — Encounter: Payer: Self-pay | Admitting: Family Medicine

## 2020-07-19 ENCOUNTER — Ambulatory Visit: Payer: 59 | Admitting: Family Medicine

## 2020-07-27 ENCOUNTER — Encounter: Payer: Self-pay | Admitting: Family Medicine

## 2020-07-27 ENCOUNTER — Other Ambulatory Visit: Payer: Self-pay

## 2020-07-27 ENCOUNTER — Ambulatory Visit (INDEPENDENT_AMBULATORY_CARE_PROVIDER_SITE_OTHER): Payer: 59 | Admitting: Family Medicine

## 2020-07-27 VITALS — BP 120/76 | HR 77 | Temp 98.0°F | Ht 66.0 in | Wt 222.0 lb

## 2020-07-27 DIAGNOSIS — F410 Panic disorder [episodic paroxysmal anxiety] without agoraphobia: Secondary | ICD-10-CM

## 2020-07-27 MED ORDER — CLONAZEPAM 0.5 MG PO TABS
0.5000 mg | ORAL_TABLET | Freq: Two times a day (BID) | ORAL | 1 refills | Status: DC | PRN
Start: 2020-07-27 — End: 2022-03-11

## 2020-07-27 NOTE — Progress Notes (Signed)
Patient: Laura Maldonado MRN: [id number] DOB: October 28, 1965 PCP: Orma Flaming, MD     Subjective:  Chief Complaint  Patient presents with  . Panic Attack    Due to continuous landscaping.    HPI: The patient is a 54 y.o. female who presents today for panik attack due to continuous  landscaping. She states it is completely situational. She states it started a week ago and had 8 hours of constant leaf blowing. She states she had a panic attack where her heart started to race and she had a feeling up in her throat and was crying. Denies any sweating. Very hyper. She states it lasted no more than an hour. She has only had one panic attack. She has xanax from the dentist, but totally forgot to take this. She is in counseling. Her husband states Laura Maldonado is still aggravated at times, but no more panic attacks. They know they need to move and this would solve all of this .  She did have similar incidents about 15-20 years ago.   Review of Systems  Constitutional: Negative for chills, diaphoresis and fever.  Respiratory: Negative for cough and shortness of breath.   Cardiovascular: Negative for chest pain, palpitations and leg swelling.  Gastrointestinal: Negative for abdominal pain, diarrhea, nausea and vomiting.  Neurological: Negative for dizziness and headaches.  Psychiatric/Behavioral: Negative for sleep disturbance. The patient is nervous/anxious.     Allergies Patient is allergic to bee venom, shellfish allergy, interferons, acetaminophen, codeine, flagyl [metronidazole], gabapentin, gluten meal, lactose, lamotrigine, latex, and oseltamivir.  Past Medical History Patient  has a past medical history of Allergy, Anxiety, Cancer ([place]), Chicken pox, Diverticulitis, Frequent headaches, GERD (gastroesophageal reflux disease), Heart murmur, Migraines, Multiple sclerosis ([place]) (05/29/2016), Neuromuscular disorder ([place]), Obesity (BMI 30-39.9) (01/15/2017), Pure hypercholesterolemia (05/29/2016),  and Vitamin D deficiency (05/29/2016).  Surgical History Patient  has a past surgical history that includes Cholecystectomy; Abdominal hysterectomy; Melanoma excision (1986); Mole removal; Colonoscopy; Upper gastrointestinal endoscopy; Cesarean section; and Breast excisional biopsy (Left, 2007).  Family History Pateint's family history includes Heart disease in her father; Hypertension in her maternal grandfather, maternal grandmother, mother, paternal grandfather, and paternal grandmother.  Social History Patient  reports that she has never smoked. She has never used smokeless tobacco. She reports current alcohol use. She reports that she does not use drugs.    Objective: Vitals:   07/27/20 0833  BP: 120/76  Pulse: 77  Temp: 98 F (36.7 C)  TempSrc: Temporal  SpO2: 99%  Weight: 222 lb (100.7 kg)  Height: 5\' 6"  (1.676 m)    Body mass index is 35.83 kg/m.  Physical Exam Vitals reviewed.  Constitutional:      General: She is not in acute distress.    Appearance: Normal appearance. She is obese. She is not ill-appearing.  HENT:     Head: Normocephalic and atraumatic.  Cardiovascular:     Rate and Rhythm: Normal rate and regular rhythm.     Heart sounds: Normal heart sounds.  Pulmonary:     Effort: Pulmonary effort is normal.     Breath sounds: Normal breath sounds.  Abdominal:     General: Bowel sounds are normal.     Palpations: Abdomen is soft.  Skin:    Capillary Refill: Capillary refill takes less than 2 seconds.  Neurological:     General: No focal deficit present.     Mental Status: She is alert and oriented to person, place, and time.  Psychiatric:  Mood and Affect: Mood normal.        Behavior: Behavior normal.        GAD 7 : Generalized Anxiety Score 07/27/2020 08/22/2019  Nervous, Anxious, on Edge 1 0  Control/stop worrying 1 0  Worry too much - different things 0 0  Trouble relaxing 0 1  Restless 0 0  Easily annoyed or irritable 1 0  Afraid -  awful might happen 0 0  Total GAD 7 Score 3 1  Anxiety Difficulty Not difficult at all Not difficult at all     Assessment/plan: 1. Panic attacks Secondary to ongoing loud noise outside of her house and feeling like she is being kicked out of her house to get some quiet. Has only had one panic attack and not had one in 20 years. She does not want daily medication. I agree with this at this point. Will do klonopin prn, discussed safer BZD than xanax. Continue counseling. Try for other measures at home to avoid noise, but would require them to leave house. If she finds she is having more panic attacks then we need to consider daily medication.    This visit occurred during the SARS-CoV-2 public health emergency.  Safety protocols were in place, including screening questions prior to the visit, additional usage of staff PPE, and extensive cleaning of exam room while observing appropriate contact time as indicated for disinfecting solutions.      Return if symptoms worsen or fail to improve.   Orma Flaming, MD [place]   07/27/2020

## 2020-07-27 NOTE — Patient Instructions (Addendum)
-   I sent in klonopin for you to take if you have another panic attack. Safer than xanax.    -for covid, vitamins I want you to have at home.  1) vitamin d3 2) vitamin C 3) zinc 4) quercetin 5) tumeric 6) honey 7) aspirin   Please let me know if you do not do well with the klonopin. Also if you start to have panic attacks more frequently we need to discuss possible daily medication, but hopefully that is not going to be the case.   Merry christmas! Dr. Rogers Blocker

## 2020-10-17 ENCOUNTER — Other Ambulatory Visit: Payer: Self-pay | Admitting: Family Medicine

## 2020-10-17 DIAGNOSIS — G8929 Other chronic pain: Secondary | ICD-10-CM

## 2020-10-17 DIAGNOSIS — R519 Headache, unspecified: Secondary | ICD-10-CM

## 2020-12-18 ENCOUNTER — Other Ambulatory Visit: Payer: Self-pay | Admitting: Family Medicine

## 2020-12-18 DIAGNOSIS — Z1231 Encounter for screening mammogram for malignant neoplasm of breast: Secondary | ICD-10-CM

## 2020-12-28 ENCOUNTER — Ambulatory Visit (INDEPENDENT_AMBULATORY_CARE_PROVIDER_SITE_OTHER): Payer: 59 | Admitting: Family Medicine

## 2020-12-28 ENCOUNTER — Encounter: Payer: Self-pay | Admitting: Family Medicine

## 2020-12-28 ENCOUNTER — Other Ambulatory Visit: Payer: Self-pay

## 2020-12-28 VITALS — BP 146/88 | HR 68 | Temp 98.1°F | Ht 66.0 in | Wt 222.0 lb

## 2020-12-28 DIAGNOSIS — E7800 Pure hypercholesterolemia, unspecified: Secondary | ICD-10-CM

## 2020-12-28 DIAGNOSIS — G35 Multiple sclerosis: Secondary | ICD-10-CM | POA: Diagnosis not present

## 2020-12-28 LAB — CBC WITH DIFFERENTIAL/PLATELET
Basophils Absolute: 0 10*3/uL (ref 0.0–0.1)
Basophils Relative: 0.8 % (ref 0.0–3.0)
Eosinophils Absolute: 0.1 10*3/uL (ref 0.0–0.7)
Eosinophils Relative: 1.2 % (ref 0.0–5.0)
HCT: 39 % (ref 36.0–46.0)
Hemoglobin: 13.2 g/dL (ref 12.0–15.0)
Lymphocytes Relative: 29.6 % (ref 12.0–46.0)
Lymphs Abs: 1.7 10*3/uL (ref 0.7–4.0)
MCHC: 33.8 g/dL (ref 30.0–36.0)
MCV: 93.3 fl (ref 78.0–100.0)
Monocytes Absolute: 0.4 10*3/uL (ref 0.1–1.0)
Monocytes Relative: 6.6 % (ref 3.0–12.0)
Neutro Abs: 3.6 10*3/uL (ref 1.4–7.7)
Neutrophils Relative %: 61.8 % (ref 43.0–77.0)
Platelets: 253 10*3/uL (ref 150.0–400.0)
RBC: 4.18 Mil/uL (ref 3.87–5.11)
RDW: 14.1 % (ref 11.5–15.5)
WBC: 5.7 10*3/uL (ref 4.0–10.5)

## 2020-12-28 LAB — LIPID PANEL
Cholesterol: 223 mg/dL — ABNORMAL HIGH (ref 0–200)
HDL: 75.6 mg/dL (ref >39.00)
LDL Cholesterol: 131 mg/dL — ABNORMAL HIGH (ref 0–99)
NonHDL: 147.71
Total CHOL/HDL Ratio: 3
Triglycerides: 82 mg/dL (ref 0.0–149.0)
VLDL: 16.4 mg/dL (ref 0.0–40.0)

## 2020-12-28 LAB — COMPREHENSIVE METABOLIC PANEL
ALT: 22 U/L (ref 0–35)
AST: 25 U/L (ref 0–37)
Albumin: 4.5 g/dL (ref 3.5–5.2)
Alkaline Phosphatase: 71 U/L (ref 39–117)
BUN: 13 mg/dL (ref 6–23)
CO2: 27 mEq/L (ref 19–32)
Calcium: 9.9 mg/dL (ref 8.4–10.5)
Chloride: 104 mEq/L (ref 96–112)
Creatinine, Ser: 0.66 mg/dL (ref 0.40–1.20)
GFR: 98.51 mL/min (ref >60.00)
Glucose, Bld: 96 mg/dL (ref 70–99)
Potassium: 4 mEq/L (ref 3.5–5.1)
Sodium: 139 mEq/L (ref 135–145)
Total Bilirubin: 0.5 mg/dL (ref 0.2–1.2)
Total Protein: 7.9 g/dL (ref 6.0–8.3)

## 2020-12-28 MED ORDER — CYCLOBENZAPRINE HCL 5 MG PO TABS
5.0000 mg | ORAL_TABLET | Freq: Three times a day (TID) | ORAL | 3 refills | Status: DC | PRN
Start: 2020-12-28 — End: 2023-10-10

## 2020-12-28 NOTE — Progress Notes (Signed)
Patient: Laura Maldonado MRN: [id number] DOB: 1966/01/29 PCP: Orma Flaming, MD     Subjective:  Chief Complaint  Patient presents with  . Panic Attack    Follow up; Situation has stopped.     HPI: The patient is a 55 y.o. female who presents today to follow up for panic attacks. She says the situation has changed, so she is maintaining. She never took the [place].  She feels like this has resolved and she doesn't need this anymore.   Hyperlipidemia She has not eaten today. Would like to recheck labs before I leave.  The 10-year ASCVD risk score Mikey Bussing DC Brooke Bonito., et al., 2013) is: 2.4%   MS Followed by [facility]. On no medical management at this time.   Review of Systems  Constitutional: Positive for fatigue. Negative for chills, diaphoresis and fever.  Respiratory: Negative for cough, chest tightness and shortness of breath.   Cardiovascular: Negative for chest pain.  Gastrointestinal: Negative for abdominal pain, nausea and vomiting.  Skin: Negative for rash.    Allergies Patient is allergic to bee venom, shellfish allergy, interferons, acetaminophen, codeine, flagyl [metronidazole], gabapentin, gluten meal, lactose, lamotrigine, latex, and oseltamivir.  Past Medical History Patient  has a past medical history of Allergy, Anxiety, Cancer ([place]), Chicken pox, Diverticulitis, Frequent headaches, GERD (gastroesophageal reflux disease), Heart murmur, Migraines, Multiple sclerosis ([place]) (05/29/2016), Neuromuscular disorder ([place]), Obesity (BMI 30-39.9) (01/15/2017), Pure hypercholesterolemia (05/29/2016), and Vitamin D deficiency (05/29/2016).  Surgical History Patient  has a past surgical history that includes Cholecystectomy; Abdominal hysterectomy; Melanoma excision (1986); Mole removal; Colonoscopy; Upper gastrointestinal endoscopy; Cesarean section; and Breast excisional biopsy (Left, 2007).  Family History Pateint's family history includes Heart disease in her father;  Hypertension in her maternal grandfather, maternal grandmother, mother, paternal grandfather, and paternal grandmother.  Social History Patient  reports that she has never smoked. She has never used smokeless tobacco. She reports current alcohol use. She reports that she does not use drugs.    Objective: Vitals:   12/28/20 1001  BP: (!) 146/88  Pulse: 68  Temp: 98.1 F (36.7 C)  TempSrc: Temporal  SpO2: 98%  Weight: 222 lb (100.7 kg)  Height: 5\' 6"  (1.676 m)    Body mass index is 35.83 kg/m.  Physical Exam Vitals reviewed.  Constitutional:      Appearance: Normal appearance. She is obese.  HENT:     Head: Normocephalic and atraumatic.  Neck:     Vascular: No carotid bruit.  Cardiovascular:     Rate and Rhythm: Normal rate and regular rhythm.     Heart sounds: Normal heart sounds.  Pulmonary:     Effort: Pulmonary effort is normal.     Breath sounds: Normal breath sounds.  Abdominal:     General: Bowel sounds are normal.     Palpations: Abdomen is soft.  Musculoskeletal:     Cervical back: Normal range of motion and neck supple.  Skin:    Capillary Refill: Capillary refill takes less than 2 seconds.  Neurological:     General: No focal deficit present.     Mental Status: She is alert and oriented to person, place, and time. Mental status is at baseline.  Psychiatric:        Mood and Affect: Mood normal.        Behavior: Behavior normal.        Assessment/plan: 1. Pure hypercholesterolemia The 10-year ASCVD risk score Mikey Bussing DC Jr., et al., 2013) is: 2.4% Repeat today.  - Lipid panel -  CBC with Differential/Platelet - Comprehensive metabolic panel  2. Multiple sclerosis ([place]) Followed by [facility]. Will give her a different muscle relaxer to have on hand in case has a flair for spasticity. Baclofen gave her tinnitus, trial flexeril and drowsy precautions discussed. Will use on as needed basis only.   Elevated BP, repeat at follow up.     Return in about 3  months (around 03/30/2021) for annual with Dr. Jerline Pain .   Orma Flaming, MD [place]   12/28/2020

## 2021-01-03 ENCOUNTER — Ambulatory Visit: Payer: 59 | Admitting: Family Medicine

## 2021-01-07 ENCOUNTER — Ambulatory Visit (HOSPITAL_BASED_OUTPATIENT_CLINIC_OR_DEPARTMENT_OTHER): Payer: 59 | Admitting: Radiology

## 2021-01-08 ENCOUNTER — Ambulatory Visit (HOSPITAL_BASED_OUTPATIENT_CLINIC_OR_DEPARTMENT_OTHER)
Admission: RE | Admit: 2021-01-08 | Discharge: 2021-01-08 | Disposition: A | Discharge status: Home / Self Care | Payer: 59 | Source: Ambulatory Visit | Attending: Family Medicine | Admitting: Family Medicine

## 2021-01-08 ENCOUNTER — Other Ambulatory Visit: Payer: Self-pay

## 2021-01-08 DIAGNOSIS — Z1231 Encounter for screening mammogram for malignant neoplasm of breast: Secondary | ICD-10-CM | POA: Insufficient documentation

## 2021-02-07 DIAGNOSIS — Z1231 Encounter for screening mammogram for malignant neoplasm of breast: Secondary | ICD-10-CM

## 2021-02-19 ENCOUNTER — Telehealth: Payer: Self-pay

## 2021-02-19 NOTE — Telephone Encounter (Signed)
Patient called regarding a jury duty excuse. She stated that she feels like she is unable to attend due to her disability. Is there any way we can write a letter of excuse for her jury duty?

## 2021-02-20 NOTE — Telephone Encounter (Signed)
Letter written, called and spoke with pt and made her aware.

## 2021-02-20 NOTE — Telephone Encounter (Signed)
Looks like she has MS. I am ok with writing a letter stating it is my medical opinion she should be excused form [employer] due to her medical disability.  Algis Greenhouse. Jerline Pain, MD 02/20/2021 10:34 AM

## 2021-02-20 NOTE — Telephone Encounter (Signed)
See below

## 2021-03-26 ENCOUNTER — Other Ambulatory Visit: Payer: Self-pay

## 2021-03-26 ENCOUNTER — Encounter: Payer: Self-pay | Admitting: Family Medicine

## 2021-03-26 ENCOUNTER — Ambulatory Visit (INDEPENDENT_AMBULATORY_CARE_PROVIDER_SITE_OTHER): Payer: 59 | Admitting: Family Medicine

## 2021-03-26 ENCOUNTER — Encounter: Payer: 59 | Admitting: Family Medicine

## 2021-03-26 VITALS — BP 134/85 | HR 73 | Temp 97.9°F | Ht 66.0 in | Wt 221.6 lb

## 2021-03-26 DIAGNOSIS — E7800 Pure hypercholesterolemia, unspecified: Secondary | ICD-10-CM

## 2021-03-26 DIAGNOSIS — C439 Malignant melanoma of skin, unspecified: Secondary | ICD-10-CM

## 2021-03-26 DIAGNOSIS — E559 Vitamin D deficiency, unspecified: Secondary | ICD-10-CM

## 2021-03-26 DIAGNOSIS — G35 Multiple sclerosis: Secondary | ICD-10-CM | POA: Diagnosis not present

## 2021-03-26 DIAGNOSIS — F331 Major depressive disorder, recurrent, moderate: Secondary | ICD-10-CM | POA: Diagnosis not present

## 2021-03-26 DIAGNOSIS — R519 Headache, unspecified: Secondary | ICD-10-CM

## 2021-03-26 DIAGNOSIS — G8929 Other chronic pain: Secondary | ICD-10-CM

## 2021-03-26 NOTE — Assessment & Plan Note (Signed)
On Topamax 25 mg twice daily.  Tolerating well.Marland Kitchen

## 2021-03-26 NOTE — Progress Notes (Signed)
Laura Maldonado is a 55 y.o. female who presents today for an office visit.  Assessment/Plan:  Chronic Problems Addressed Today: Multiple sclerosis ([place]) Follows with [facility] every 6 months. Not currently on any DMT.   Pure hypercholesterolemia Not on statin.  Last LDL 131.  Discussed lifestyle modifications.  She will try working on exercising more as the weather gets cooler.  Vitamin D deficiency We can check vitamin D next blood draw.  Melanoma of skin ([place]) Follows with dermatology.    Moderate episode of recurrent major depressive disorder (HCC) Overall stable.  She uses Xanax as needed for stressful events.  Also has a prescription for clonazepam to use as needed.  She uses these very infrequently.  Chronic nonintractable headache On Topamax 25 mg twice daily.  Tolerating well..     Subjective:  HPI:  She is here to transfer of care. Previous PCP no longer works at this office.  See a/p for status of chronic conditions.   She has had one flair of multiple sclerosis in January 2021. She [facility]. She see her neurologist  Dr. Diamantina Monks every 6 months. Today she state she felt  flutter sensation while talking.  In addition to this, she seeing her dermatologist for melanoma of skin. She recently moved from [place] to [place]. She have no other complaint today.    ROS: Per HPI, otherwise a complete review of systems was negative.   PMH:  The following were reviewed and entered/updated in epic: Past Medical History:  Diagnosis Date   Allergy    Anxiety    Cancer ([place])    melanoma   Chicken pox    Diverticulitis    Frequent headaches    GERD (gastroesophageal reflux disease)    Heart murmur    past hx detected by one MD   Migraines    Multiple sclerosis ([place]) 05/29/2016   [facility] q 6 months.   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   Melanoma of skin ([place]) 08/22/2019   Diverticulosis 08/22/2019   Chronic nonintractable headache 03/08/2017   Moderate episode of recurrent major depressive disorder ([place]) 03/08/2017   Vitamin D deficiency 05/29/2016   Pure hypercholesterolemia 05/29/2016   Multiple sclerosis ([place]) 05/29/2016   Past Surgical History:  Procedure Laterality Date   ABDOMINAL HYSTERECTOMY     BREAST EXCISIONAL BIOPSY Left 2007   CESAREAN SECTION     CHOLECYSTECTOMY     COLONOSCOPY     MELANOMA EXCISION  1986   MOLE REMOVAL     UPPER GASTROINTESTINAL ENDOSCOPY      Family History  Problem Relation Age of Onset   Hypertension Mother    Heart disease Father    Hypertension Maternal Grandmother    Hypertension Maternal Grandfather    Hypertension Paternal Grandmother    Hypertension Paternal Grandfather    Colon cancer Neg Hx    Colon polyps Neg Hx    Esophageal cancer Neg Hx    Rectal cancer Neg Hx    Stomach cancer Neg Hx     Medications- reviewed and updated Current Outpatient Medications  Medication Sig Dispense Refill   ALPRAZolam (XANAX) 0.5 MG tablet Take 1 tablet (0.5 mg total) by mouth daily as needed for anxiety (for MRI). 10  tablet 0   Ascorbic Acid (VITAMIN C) 500 MG/5ML LIQD      B Complex Vitamins (B COMPLEX 100 PO) Take 1 capsule by mouth daily.     Calcium Carb-Cholecalciferol (CALCIUM 1000 + D PO) Take 1 tablet by mouth daily.     cholecalciferol (VITAMIN D3) 25 MCG (1000 UNIT) tablet      clonazePAM (KLONOPIN) 0.5 MG tablet Take 1 tablet (0.5 mg total) by mouth 2 (two) times daily as needed for anxiety. 20 tablet 1   cyclobenzaprine (FLEXERIL) 5 MG tablet Take 1 tablet (5 mg total) by mouth 3 (three) times daily as needed for muscle spasms. 30 tablet 3   Digestive Enzyme CAPS Take by mouth. Every meal     JUBLIA 10 % SOLN      Magnesium 500 MG TABS Take by  mouth.     Probiotic Product (PROBIOTIC MATURE ADULT) CAPS Take by mouth.     topiramate (TOPAMAX) 25 MG tablet TAKE 1 TABLET BY MOUTH TWICE A DAY 180 tablet 2   Turmeric 500 MG CAPS Take by mouth.     Zinc 22.5 MG TABS      No current facility-administered medications for this visit.    Allergies-reviewed and updated Allergies  Allergen Reactions   Bee Venom Anaphylaxis, Itching and Swelling   Shellfish Allergy Shortness Of Breath   Interferons Other (See Comments)    Chest pains, burning trouble breathing itching   Acetaminophen Other (See Comments)    Elevates live enzymes   Codeine Nausea Only   Flagyl [Metronidazole] Other (See Comments)    Migraines    Gabapentin Hives   Gluten Meal Diarrhea and Other (See Comments)    Stomach pain    Lactose     Other reaction(s): Unknown   Lamotrigine Hives   Latex    Oseltamivir Other (See Comments)    Social History   Socioeconomic History   Marital status: Married    Spouse name: Not on file   Number of children: Not on file   Years of education: Not on file   Highest education level: Not on file  Occupational History   Not on file  Tobacco Use   Smoking status: Never   Smokeless tobacco: Never  Substance and Sexual Activity   Alcohol use: Yes    Comment: rare- maybe 5 x a year    Drug use: No   Sexual activity: Not on file  Other Topics Concern   Not on file  Social History Narrative   Not on file   Social Determinants of Health   Financial Resource Strain: Not on file  Food Insecurity: Not on file  Transportation Needs: Not on file  Physical Activity: Not on file  Stress: Not on file  Social Connections: Not on file          Objective:  Physical Exam: BP 134/85   Pulse 73   Temp 97.9 F (36.6 C) (Oral)   Ht '5\' 6"'$  (1.676 m)   Wt 221 lb 9.6 oz (100.5 kg)   LMP  (LMP Unknown)   SpO2 98%   BMI 35.77 kg/m   Gen: No acute distress, resting comfortably CV: Regular rate and rhythm with no murmurs  appreciated Pulm: Normal work of breathing, clear to auscultation bilaterally with no crackles, wheezes, or rhonchi Neuro: Grossly normal, moves all extremities Psych: Normal affect and thought content       I,Savera Zaman,acting as a scribe for Dimas Chyle, MD.,have documented all  relevant documentation on the behalf of Dimas Chyle, MD,as directed by  Dimas Chyle, MD while in the presence of Dimas Chyle, MD.  I, Dimas Chyle, MD, have reviewed all documentation for this visit. The documentation on 03/26/21 for the exam, diagnosis, procedures, and orders are all accurate and complete.  Time Spent: 45 minutes of total time was spent on the date of the encounter performing the following actions: chart review prior to seeing the patient, obtaining history including recent visits with previous PCP and specialists, performing a medically necessary exam, counseling on the treatment plan, placing orders, and documenting in our EHR.    Algis Greenhouse. Jerline Pain, MD 03/26/2021 11:37 AM

## 2021-03-26 NOTE — Assessment & Plan Note (Signed)
We can check vitamin D next blood draw.

## 2021-03-26 NOTE — Assessment & Plan Note (Signed)
Follows with dermatology 

## 2021-03-26 NOTE — Assessment & Plan Note (Signed)
Not on statin.  Last LDL 131.  Discussed lifestyle modifications.  She will try working on exercising more as the weather gets cooler.

## 2021-03-26 NOTE — Assessment & Plan Note (Signed)
Follows with [facility] every 6 months. Not currently on any DMT.

## 2021-03-26 NOTE — Patient Instructions (Signed)
It was very nice to see you today!  I am glad that you are doing well.  No changes today.  I will see you back in a year for your annual checkup.  Please come back to see me sooner if needed.  Take care, Dr Jerline Pain  PLEASE NOTE:  If you had any lab tests please let us know if you have not heard back within a few days. You may see your results on mychart before we have a chance to review them but we will give you a call once they are reviewed by [place]. If we ordered any referrals today, please let us know if you have not heard from their office within the next week.   Please try these tips to maintain a healthy lifestyle:  Eat at least 3 REAL meals and 1-2 snacks per day.  Aim for no more than 5 hours between eating.  If you eat breakfast, please do so within one hour of getting up.   Each meal should contain half fruits/vegetables, one quarter protein, and one quarter carbs (no bigger than a computer mouse)  Cut down on sweet beverages. This includes juice, soda, and sweet tea.   Drink at least 1 glass of water with each meal and aim for at least 8 glasses per day  Exercise at least 150 minutes every week.

## 2021-03-26 NOTE — Assessment & Plan Note (Signed)
Overall stable.  She uses Xanax as needed for stressful events.  Also has a prescription for clonazepam to use as needed.  She uses these very infrequently.

## 2021-05-29 ENCOUNTER — Telehealth: Payer: Self-pay

## 2021-05-29 NOTE — Telephone Encounter (Signed)
Pt called stating that she was exposed to someone with COVID. Laura Maldonado wants to speak to someone on what she should do. Please Advise.

## 2021-05-30 NOTE — Telephone Encounter (Signed)
Spoke with patient was exposed 6 days ago, patient had 2 rapid home test with negative results  Will do a PCR today to make sure

## 2021-05-30 NOTE — Telephone Encounter (Signed)
Noted. Agree with plan. Would like for her to let us know if her test is positive or if she gets symptoms.  Algis Greenhouse. Jerline Pain, MD 05/30/2021 9:16 AM

## 2021-06-27 ENCOUNTER — Ambulatory Visit: Payer: 59 | Admitting: Family Medicine

## 2021-07-15 ENCOUNTER — Other Ambulatory Visit: Payer: Self-pay | Admitting: Family Medicine

## 2021-07-15 DIAGNOSIS — R519 Headache, unspecified: Secondary | ICD-10-CM

## 2021-10-14 ENCOUNTER — Telehealth (INDEPENDENT_AMBULATORY_CARE_PROVIDER_SITE_OTHER): Payer: 59 | Admitting: Family Medicine

## 2021-10-14 ENCOUNTER — Other Ambulatory Visit: Payer: Self-pay

## 2021-10-14 ENCOUNTER — Encounter: Payer: Self-pay | Admitting: Family Medicine

## 2021-10-14 DIAGNOSIS — K5790 Diverticulosis of intestine, part unspecified, without perforation or abscess without bleeding: Secondary | ICD-10-CM | POA: Diagnosis not present

## 2021-10-14 MED ORDER — AMOXICILLIN-POT CLAVULANATE 875-125 MG PO TABS: 1.0000 | ORAL_TABLET | Freq: Two times a day (BID) | ORAL | 0 refills | Status: DC

## 2021-10-14 NOTE — Assessment & Plan Note (Signed)
Symptoms consistent with diverticular flare as above.  Will start Augmentin.

## 2021-10-14 NOTE — Progress Notes (Signed)
° °  Laura Maldonado is a 56 y.o. female who presents today for a virtual office visit.  Assessment/Plan:  Abdominal Pain Discussed limitations of virtual visit and inability perform physical exam.  Based on her constellation of symptoms concern for diverticular flare.  We will empirically start Augmentin.  Encouraged hydration.  She will let me know if not improving and we can check CT scan or refer to GI.  We discussed reasons to return to care.  Chronic Problems Addressed Today: Diverticulosis Symptoms consistent with diverticular flare as above.  Will start Augmentin.     Subjective:  HPI:  Patient here with abdominal bloating, cramping, and diarrhea. Started about a week ago. Worsening the last few days. Any fluid or food will make her have a flare of symptoms. No blood. A lot of bloating. She has noticed some pain when laying on her left side. She has had more gas production as well. No fevers or chills. No nausea or vomiting. Tried taking imodium cautiously which did not seem to help.        Objective/Observations  Physical Exam: Gen: NAD, resting comfortably Pulm: Normal work of breathing Neuro: Grossly normal, moves all extremities Psych: Normal affect and thought content  Virtual Visit via Video   I connected with Laura Maldonado on 10/14/21 at  3:40 PM EST by a video enabled telemedicine application and verified that I am speaking with the correct person using two identifiers. The limitations of evaluation and management by telemedicine and the availability of in person appointments were discussed. The patient expressed understanding and agreed to proceed.   Patient location: Home Provider location: [place] participating in the virtual visit: Myself and Patient     Algis Greenhouse. Jerline Pain, MD 10/14/2021 2:33 PM

## 2021-10-16 ENCOUNTER — Telehealth: Payer: 59 | Admitting: Family Medicine

## 2021-12-06 ENCOUNTER — Other Ambulatory Visit (HOSPITAL_BASED_OUTPATIENT_CLINIC_OR_DEPARTMENT_OTHER): Payer: Self-pay | Admitting: Family Medicine

## 2021-12-06 DIAGNOSIS — Z1231 Encounter for screening mammogram for malignant neoplasm of breast: Secondary | ICD-10-CM

## 2021-12-27 ENCOUNTER — Ambulatory Visit (HOSPITAL_BASED_OUTPATIENT_CLINIC_OR_DEPARTMENT_OTHER)
Admission: RE | Admit: 2021-12-27 | Discharge: 2021-12-27 | Disposition: A | Discharge status: Home / Self Care | Payer: 59 | Source: Ambulatory Visit | Attending: Family Medicine | Admitting: Family Medicine

## 2021-12-27 DIAGNOSIS — Z1231 Encounter for screening mammogram for malignant neoplasm of breast: Secondary | ICD-10-CM | POA: Insufficient documentation

## 2022-02-13 ENCOUNTER — Encounter: Payer: 59 | Admitting: Family Medicine

## 2022-03-11 ENCOUNTER — Ambulatory Visit (INDEPENDENT_AMBULATORY_CARE_PROVIDER_SITE_OTHER): Payer: 59 | Admitting: Family Medicine

## 2022-03-11 ENCOUNTER — Encounter: Payer: Self-pay | Admitting: Family Medicine

## 2022-03-11 ENCOUNTER — Ambulatory Visit (HOSPITAL_BASED_OUTPATIENT_CLINIC_OR_DEPARTMENT_OTHER)
Admission: RE | Admit: 2022-03-11 | Discharge: 2022-03-11 | Disposition: A | Discharge status: Home / Self Care | Payer: 59 | Source: Ambulatory Visit | Attending: Family Medicine | Admitting: Family Medicine

## 2022-03-11 VITALS — BP 128/74 | HR 68 | Temp 98.2°F | Ht 66.0 in | Wt 218.4 lb

## 2022-03-11 DIAGNOSIS — G35 Multiple sclerosis: Secondary | ICD-10-CM

## 2022-03-11 DIAGNOSIS — R739 Hyperglycemia, unspecified: Secondary | ICD-10-CM | POA: Diagnosis not present

## 2022-03-11 DIAGNOSIS — C439 Malignant melanoma of skin, unspecified: Secondary | ICD-10-CM

## 2022-03-11 DIAGNOSIS — H9190 Unspecified hearing loss, unspecified ear: Secondary | ICD-10-CM

## 2022-03-11 DIAGNOSIS — E785 Hyperlipidemia, unspecified: Secondary | ICD-10-CM | POA: Diagnosis not present

## 2022-03-11 DIAGNOSIS — M549 Dorsalgia, unspecified: Secondary | ICD-10-CM

## 2022-03-11 DIAGNOSIS — Z0001 Encounter for general adult medical examination with abnormal findings: Secondary | ICD-10-CM

## 2022-03-11 DIAGNOSIS — E559 Vitamin D deficiency, unspecified: Secondary | ICD-10-CM | POA: Diagnosis not present

## 2022-03-11 DIAGNOSIS — H9319 Tinnitus, unspecified ear: Secondary | ICD-10-CM | POA: Diagnosis not present

## 2022-03-11 DIAGNOSIS — F331 Major depressive disorder, recurrent, moderate: Secondary | ICD-10-CM

## 2022-03-11 DIAGNOSIS — E7800 Pure hypercholesterolemia, unspecified: Secondary | ICD-10-CM

## 2022-03-11 LAB — COMPREHENSIVE METABOLIC PANEL
ALT: 20 U/L (ref 0–35)
AST: 26 U/L (ref 0–37)
Albumin: 4.4 g/dL (ref 3.5–5.2)
Alkaline Phosphatase: 78 U/L (ref 39–117)
BUN: 12 mg/dL (ref 6–23)
CO2: 26 mEq/L (ref 19–32)
Calcium: 9.9 mg/dL (ref 8.4–10.5)
Chloride: 105 mEq/L (ref 96–112)
Creatinine, Ser: 0.68 mg/dL (ref 0.40–1.20)
GFR: 96.98 mL/min (ref >60.00)
Glucose, Bld: 92 mg/dL (ref 70–99)
Potassium: 4.5 mEq/L (ref 3.5–5.1)
Sodium: 138 mEq/L (ref 135–145)
Total Bilirubin: 0.5 mg/dL (ref 0.2–1.2)
Total Protein: 7.9 g/dL (ref 6.0–8.3)

## 2022-03-11 LAB — LIPID PANEL
Cholesterol: 214 mg/dL — ABNORMAL HIGH (ref 0–200)
HDL: 65.8 mg/dL (ref >39.00)
LDL Cholesterol: 134 mg/dL — ABNORMAL HIGH (ref 0–99)
NonHDL: 148.19
Total CHOL/HDL Ratio: 3
Triglycerides: 70 mg/dL (ref 0.0–149.0)
VLDL: 14 mg/dL (ref 0.0–40.0)

## 2022-03-11 LAB — CBC
HCT: 41.5 % (ref 36.0–46.0)
Hemoglobin: 13.8 g/dL (ref 12.0–15.0)
MCHC: 33.3 g/dL (ref 30.0–36.0)
MCV: 92.9 fl (ref 78.0–100.0)
Platelets: 289 10*3/uL (ref 150.0–400.0)
RBC: 4.47 Mil/uL (ref 3.87–5.11)
RDW: 13.8 % (ref 11.5–15.5)
WBC: 5.9 10*3/uL (ref 4.0–10.5)

## 2022-03-11 LAB — TSH: TSH: 1.36 u[IU]/mL (ref 0.35–5.50)

## 2022-03-11 LAB — HEMOGLOBIN A1C: Hgb A1c MFr Bld: 5.5 % (ref 4.6–6.5)

## 2022-03-11 LAB — VITAMIN D 25 HYDROXY (VIT D DEFICIENCY, FRACTURES): VITD: 36.02 ng/mL (ref 30.00–100.00)

## 2022-03-11 NOTE — Assessment & Plan Note (Signed)
Follows with [facility] neurology.  Symptoms are stable.

## 2022-03-11 NOTE — Assessment & Plan Note (Signed)
Continue management per dermatology.  

## 2022-03-11 NOTE — Patient Instructions (Addendum)
It was very nice to see you today!  We will check blood work today.  Please go to the [facility] to have an x-ray done.  Let me know if you like to have a referral to see a physical therapist.  Please continue to work on diet and exercise.  We will refer you to see an audiologist.  I will see back in year for your next physical.  Please come back to see [place] sooner if needed.    Take care, Dr Jerline Pain  PLEASE NOTE:  If you had any lab tests please let us know if you have not heard back within a few days. You may see your results on mychart before we have a chance to review them but we will give you a call once they are reviewed by [place]. If we ordered any referrals today, please let us know if you have not heard from their office within the next week.   Please try these tips to maintain a healthy lifestyle:  Eat at least 3 REAL meals and 1-2 snacks per day.  Aim for no more than 5 hours between eating.  If you eat breakfast, please do so within one hour of getting up.   Each meal should contain half fruits/vegetables, one quarter protein, and one quarter carbs (no bigger than a computer mouse)  Cut down on sweet beverages. This includes juice, soda, and sweet tea.   Drink at least 1 glass of water with each meal and aim for at least 8 glasses per day  Exercise at least 150 minutes every week.    Preventive Care 31-70 Years Old, Female Preventive care refers to lifestyle choices and visits with your health care provider that can promote health and wellness. Preventive care visits are also called wellness exams. What can I expect for my preventive care visit? Counseling Your health care provider may ask you questions about your: Medical history, including: Past medical problems. Family medical history. Pregnancy history. Current health, including: Menstrual cycle. Method of birth control. Emotional well-being. Home life and relationship well-being. Sexual activity and  sexual health. Lifestyle, including: Alcohol, nicotine or tobacco, and drug use. Access to firearms. Diet, exercise, and sleep habits. Work and work Statistician. Sunscreen use. Safety issues such as seatbelt and bike helmet use. Physical exam Your health care provider will check your: Height and weight. These may be used to calculate your BMI (body mass index). BMI is a measurement that tells if you are at a healthy weight. Waist circumference. This measures the distance around your waistline. This measurement also tells if you are at a healthy weight and may help predict your risk of certain diseases, such as type 2 diabetes and high blood pressure. Heart rate and blood pressure. Body temperature. Skin for abnormal spots. What immunizations do I need?  Vaccines are usually given at various ages, according to a schedule. Your health care provider will recommend vaccines for you based on your age, medical history, and lifestyle or other factors, such as travel or where you work. What tests do I need? Screening Your health care provider may recommend screening tests for certain conditions. This may include: Lipid and cholesterol levels. Diabetes screening. This is done by checking your blood sugar (glucose) after you have not eaten for a while (fasting). Pelvic exam and Pap test. Hepatitis B test. Hepatitis C test. HIV (human immunodeficiency virus) test. STI (sexually transmitted infection) testing, if you are at risk. Lung cancer screening. Colorectal cancer screening. Mammogram.  Talk with your health care provider about when you should start having regular mammograms. This may depend on whether you have a family history of breast cancer. BRCA-related cancer screening. This may be done if you have a family history of breast, ovarian, tubal, or peritoneal cancers. Bone density scan. This is done to screen for osteoporosis. Talk with your health care provider about your test results,  treatment options, and if necessary, the need for more tests. Follow these instructions at home: Eating and drinking  Eat a diet that includes fresh fruits and vegetables, whole grains, lean protein, and low-fat dairy products. Take vitamin and mineral supplements as recommended by your health care provider. Do not drink alcohol if: Your health care provider tells you not to drink. You are pregnant, may be pregnant, or are planning to become pregnant. If you drink alcohol: Limit how much you have to 0-1 drink a day. Know how much alcohol is in your drink. In the [place] one drink equals one 12 oz bottle of beer (355 mL), one 5 oz glass of wine (148 mL), or one 1 oz glass of hard liquor (44 mL). Lifestyle Brush your teeth every morning and night with fluoride toothpaste. Floss one time each day. Exercise for at least 30 minutes 5 or more days each week. Do not use any products that contain nicotine or tobacco. These products include cigarettes, chewing tobacco, and vaping devices, such as e-cigarettes. If you need help quitting, ask your health care provider. Do not use drugs. If you are sexually active, practice safe sex. Use a condom or other form of protection to prevent STIs. If you do not wish to become pregnant, use a form of birth control. If you plan to become pregnant, see your health care provider for a prepregnancy visit. Take aspirin only as told by your health care provider. Make sure that you understand how much to take and what form to take. Work with your health care provider to find out whether it is safe and beneficial for you to take aspirin daily. Find healthy ways to manage stress, such as: Meditation, yoga, or listening to music. Journaling. Talking to a trusted person. Spending time with friends and family. Minimize exposure to UV radiation to reduce your risk of skin cancer. Safety Always wear your seat belt while driving or riding in a vehicle. Do not drive: If you  have been drinking alcohol. Do not ride with someone who has been drinking. When you are tired or distracted. While texting. If you have been using any mind-altering substances or drugs. Wear a helmet and other protective equipment during sports activities. If you have firearms in your house, make sure you follow all gun safety procedures. Seek help if you have been physically or sexually abused. What's next? Visit your health care provider once a year for an annual wellness visit. Ask your health care provider how often you should have your eyes and teeth checked. Stay up to date on all vaccines. This information is not intended to replace advice given to you by your health care provider. Make sure you discuss any questions you have with your health care provider. Document Revised: 01/30/2021 Document Reviewed: 01/30/2021 Elsevier Patient Education  [place].

## 2022-03-11 NOTE — Assessment & Plan Note (Signed)
Check lipids. Discussed lifestyle modifications.  

## 2022-03-11 NOTE — Progress Notes (Signed)
Chief Complaint:  Laura Maldonado is a 56 y.o. female who presents today for her annual comprehensive physical exam.    Assessment/Plan:  New/Acute Problems: Midback pain No red flags.  Given that symptoms have been persistent for several months we will check x-ray to further evaluate.  She can use a heating pad.  She can continue using Flexeril and over-the-counter meds as needed.  We discussed referral to PT however she deferred for now.  She will let us know if symptoms do not continue to improve and we can refer to PT and/or sports medicine.  Chronic Problems Addressed Today: Multiple sclerosis (Laura Maldonado) Follows with [facility].  Symptoms are stable.  Pure hypercholesterolemia Check lipids.  Discussed lifestyle modifications.  Vitamin D deficiency Check vitamin D.  Tinnitus We will refer to audiology.  She failed her hearing screen today.  Melanoma of skin (Laura Maldonado) Continue management per dermatology.  Moderate episode of recurrent major depressive disorder (HCC) Symptoms are overall stable.  Uses Xanax as needed.  Does not need refill today.  Preventative Healthcare: Check labs.  Up-to-date on cancer screening.  Shingles vaccine deferred.  Patient Counseling(The following topics were reviewed and/or handout was given):  -Nutrition: Stressed importance of moderation in sodium/caffeine intake, saturated fat and cholesterol, caloric balance, sufficient intake of fresh fruits, vegetables, and fiber.  -Stressed the importance of regular exercise.   -Substance Abuse: Discussed cessation/primary prevention of tobacco, alcohol, or other drug use; driving or other dangerous activities under the influence; availability of treatment for abuse.   -Injury prevention: Discussed safety belts, safety helmets, smoke detector, smoking near bedding or upholstery.   -Sexuality: Discussed sexually transmitted diseases, partner selection, use of condoms, avoidance of unintended pregnancy and  contraceptive alternatives.   -Dental health: Discussed importance of regular tooth brushing, flossing, and dental visits.  -Health maintenance and immunizations reviewed. Please refer to Health maintenance section.  Return to care in 1 year for next preventative visit.     Subjective:  HPI:  See A/p for status of chronic conditions  She has had mid back pain since she fell about 7 months ago. States that she fell backwards into a window sill after tripping on a floor pedal system. She struck her mid back. She had immediate pain but this improved over a few days.  Ever since the fall she has had persistent intermittent mid right-sided back pain.  Comes and goes.  Worse with certain motions.  No specific treatments tried.  She has also noticed a chirping sound in her left ear. This is usually more noticeable when things are quiet. Comes and goes.  She is not aware of any hearing loss.  Lifestyle Diet: Balanced. Plenty of fruits and vegetables.  Exercise: Limited. Tries to do stretches.      03/11/2022    9:34 AM  Depression screen PHQ 2/9  Decreased Interest 0  Down, Depressed, Hopeless 0  PHQ - 2 Score 0  Altered sleeping 1  Tired, decreased energy 0  Change in appetite 0  Feeling bad or failure about yourself  0  Trouble concentrating 0  Moving slowly or fidgety/restless 0  Suicidal thoughts 0  PHQ-9 Score 1  Difficult doing work/chores Not difficult at all    There are no preventive care reminders to display for this patient.   ROS: Per HPI, otherwise a complete review of systems was negative.   PMH:  The following were reviewed and entered/updated in epic: Past Medical History:  Diagnosis Date  Allergy    Anxiety    Cancer ([place])    melanoma   Chicken pox    Diverticulitis    Frequent headaches    GERD (gastroesophageal reflux disease)    Heart murmur    past hx detected by one MD   Migraines    Multiple sclerosis (Laura Maldonado) 05/29/2016   [facility] q 6 months.    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   Melanoma of skin (Laura Maldonado) 08/22/2019   Diverticulosis 08/22/2019   Chronic nonintractable headache 03/08/2017   Moderate episode of recurrent major depressive disorder (Laura Maldonado) 03/08/2017   Vitamin D deficiency 05/29/2016   Pure hypercholesterolemia 05/29/2016   Multiple sclerosis (Laura Maldonado) 05/29/2016   Past Surgical History:  Procedure Laterality Date   ABDOMINAL HYSTERECTOMY     BREAST EXCISIONAL BIOPSY Left 2007   CESAREAN SECTION     CHOLECYSTECTOMY     COLONOSCOPY     MELANOMA EXCISION  1986   MOLE REMOVAL     UPPER GASTROINTESTINAL ENDOSCOPY      Family History  Problem Relation Age of Onset   Hypertension Mother    Heart disease Father    Hypertension Maternal Grandmother    Hypertension Maternal Grandfather    Hypertension Paternal Grandmother    Hypertension Paternal Grandfather    Colon cancer Neg Hx    Colon polyps Neg Hx    Esophageal cancer Neg Hx    Rectal cancer Neg Hx    Stomach cancer Neg Hx     Medications- reviewed and updated Current Outpatient Medications  Medication Sig Dispense Refill   ALPRAZolam (XANAX) 0.5 MG tablet Take 1 tablet (0.5 mg total) by mouth daily as needed for anxiety (for MRI). 10 tablet 0   Ascorbic Acid (VITAMIN C) 500 MG/5ML LIQD      B Complex Vitamins (B COMPLEX 100 PO) Take 1 capsule by mouth daily.     Calcium Carb-Cholecalciferol (CALCIUM 1000 + D PO) Take 1 tablet by mouth daily.     cholecalciferol (VITAMIN D3) 25 MCG (1000 UNIT) tablet      cyclobenzaprine (FLEXERIL) 5 MG tablet Take 1 tablet (5 mg total) by mouth 3 (three) times daily as needed for muscle spasms. 30 tablet 3   Digestive Enzyme CAPS Take by mouth. Every meal     JUBLIA 10 % SOLN      Magnesium 500  MG TABS Take by mouth.     Probiotic Product (PROBIOTIC MATURE ADULT) CAPS Take by mouth.     topiramate (TOPAMAX) 25 MG tablet TAKE 1 TABLET BY MOUTH TWICE A DAY 180 tablet 2   Turmeric 500 MG CAPS Take by mouth.     Zinc 22.5 MG TABS      No current facility-administered medications for this visit.    Allergies-reviewed and updated Allergies  Allergen Reactions   Bee Venom Anaphylaxis, Itching and Swelling   Shellfish Allergy Shortness Of Breath   Interferons Other (See Comments)    Chest pains, burning trouble breathing itching   Acetaminophen Other (See Comments)    Elevates live enzymes   Codeine Nausea Only   Flagyl [Metronidazole] Other (See Comments)    Migraines    Gabapentin Hives   Gluten Meal Diarrhea and Other (See Comments)  Stomach pain    Lactose     Other reaction(s): Unknown   Lamotrigine Hives   Latex    Oseltamivir Other (See Comments)    Social History   Socioeconomic History   Marital status: Married    Spouse name: Not on file   Number of children: Not on file   Years of education: Not on file   Highest education level: Not on file  Occupational History   Not on file  Tobacco Use   Smoking status: Never   Smokeless tobacco: Never  Substance and Sexual Activity   Alcohol use: Yes    Comment: rare- maybe 5 x a year    Drug use: No   Sexual activity: Not on file  Other Topics Concern   Not on file  Social History Narrative   Not on file   Social Determinants of Health   Financial Resource Strain: Not on file  Food Insecurity: Not on file  Transportation Needs: Not on file  Physical Activity: Not on file  Stress: Not on file  Social Connections: Not on file        Objective:  Physical Exam: BP 128/74   Pulse 68   Temp 98.2 F (36.8 C) (Temporal)   Ht '5\' 6"'$  (1.676 m)   Wt 218 lb 6.4 oz (99.1 kg)   LMP  (LMP Unknown)   SpO2 98%   BMI 35.25 kg/m   Body mass index is 35.25 kg/m. Wt Readings from Last 3 Encounters:   03/11/22 218 lb 6.4 oz (99.1 kg)  03/26/21 221 lb 9.6 oz (100.5 kg)  12/28/20 222 lb (100.7 kg)   Gen: NAD, resting comfortably HEENT: TMs normal bilaterally. OP clear. No thyromegaly noted.  CV: RRR with no murmurs appreciated Pulm: NWOB, CTAB with no crackles, wheezes, or rhonchi GI: Normal bowel sounds present. Soft, Nontender, Nondistended. MSK: no edema, cyanosis, or clubbing noted - Back: No deformities.  Nontender to palpation.  Neurovascular intact distally Skin: warm, dry Neuro: CN2-12 grossly intact. Strength 5/5 in upper and lower extremities. Reflexes symmetric and intact bilaterally.  Psych: Normal affect and thought content     Demetrice Amstutz M. Jerline Pain, MD 03/11/2022 10:35 AM

## 2022-03-11 NOTE — Assessment & Plan Note (Signed)
Symptoms are overall stable.  Uses Xanax as needed.  Does not need refill today.

## 2022-03-11 NOTE — Assessment & Plan Note (Signed)
Check vitamin D. 

## 2022-03-11 NOTE — Assessment & Plan Note (Signed)
We will refer to audiology.  She failed her hearing screen today.

## 2022-03-14 NOTE — Progress Notes (Signed)
Please inform patient of the following:  Her cholesterol is little bit elevated but everything else is stable.  She should continue to work on diet and exercise and we can recheck these in a year.  Her x-rays show that she has mild degenerative changes in her lower back.  No signs of a fracture or any other obvious abnormalities.  If her back pain is continuing we can refer her to see physical therapy or sports medicine.

## 2022-04-08 ENCOUNTER — Other Ambulatory Visit: Payer: Self-pay | Admitting: Family Medicine

## 2022-04-08 DIAGNOSIS — G8929 Other chronic pain: Secondary | ICD-10-CM

## 2022-04-14 ENCOUNTER — Encounter: Payer: Self-pay | Admitting: Family Medicine

## 2022-04-16 ENCOUNTER — Telehealth: Payer: Self-pay | Admitting: Family Medicine

## 2022-04-16 NOTE — Telephone Encounter (Signed)
Patient is requesting to transfer from [place] to [place].  Patient feels like she would be comfortable with a female provider.  Also states spouse is a current patient of Morene Rankins.  Please advise.

## 2022-04-18 NOTE — Telephone Encounter (Signed)
Noted  

## 2022-04-22 NOTE — Telephone Encounter (Signed)
Patient states: - As seen below, she thought about Aldona Bar Worley's recommendation to transfer to Dr. Esther Hardy and agrees with this   Would this transfer of care from Dr. Jerline Pain to Dr. Cherlynn Kaiser be okay with you?

## 2022-04-23 NOTE — Telephone Encounter (Signed)
See note

## 2022-04-23 NOTE — Telephone Encounter (Signed)
Ok with me 

## 2022-05-08 ENCOUNTER — Ambulatory Visit (INDEPENDENT_AMBULATORY_CARE_PROVIDER_SITE_OTHER): Payer: 59 | Admitting: Family Medicine

## 2022-05-08 ENCOUNTER — Encounter: Payer: Self-pay | Admitting: Family Medicine

## 2022-05-08 VITALS — BP 100/68 | HR 86 | Temp 97.9°F | Ht 66.0 in | Wt 216.8 lb

## 2022-05-08 DIAGNOSIS — G35 Multiple sclerosis: Secondary | ICD-10-CM

## 2022-05-08 DIAGNOSIS — Z7189 Other specified counseling: Secondary | ICD-10-CM

## 2022-05-08 DIAGNOSIS — L603 Nail dystrophy: Secondary | ICD-10-CM | POA: Insufficient documentation

## 2022-05-08 NOTE — Patient Instructions (Signed)
It was very nice to see you today!  Good luck   PLEASE NOTE:  If you had any lab tests please let us know if you have not heard back within a few days. You may see your results on MyChart before we have a chance to review them but we will give you a call once they are reviewed by [place]. If we ordered any referrals today, please let us know if you have not heard from their office within the next week.   Please try these tips to maintain a healthy lifestyle:  Eat most of your calories during the day when you are active. Eliminate processed foods including packaged sweets (pies, cakes, cookies), reduce intake of potatoes, white bread, white pasta, and white rice. Look for whole grain options, oat flour or almond flour.  Each meal should contain half fruits/vegetables, one quarter protein, and one quarter carbs (no bigger than a computer mouse).  Cut down on sweet beverages. This includes juice, soda, and sweet tea. Also watch fruit intake, though this is a healthier sweet option, it still contains natural sugar! Limit to 3 servings daily.  Drink at least 1 glass of water with each meal and aim for at least 8 glasses per day  Exercise at least 150 minutes every week.

## 2022-05-08 NOTE — Progress Notes (Signed)
Subjective:     Patient ID: Laura Maldonado, female    DOB: [birth year], 56 y.o.   MRN: [id number]  Chief Complaint  Patient presents with   Transfer of Care    From Dr.Parker   Wants to discuss COVID protocol    HPI TOC Dr. Otilio Jefferson female provider  Covid-has heard about a lot of sick friends even though they took all covid vaccines.  Pt not exposed to any of them. Just wanting to learn what can she do /who to call, if gets.  Saw [facility]-rec medrol dosepk if extensive work.  They will be doing "deep cleaning" and may cause flare of MS. She's trying to get scheduled.  MS-reaction to many meds so not on any.  Last bad flare 2021. At times, legs can just go out.  Trying to stay active.   Sens to meds.  Muscle spasms at times.  Sees neuro at [facility]  There are no preventive care reminders to display for this patient.   Past Medical History:  Diagnosis Date   Allergy    Anxiety    Cancer ([place])    melanoma-back   Chicken pox    Diverticulitis    Frequent headaches    GERD (gastroesophageal reflux disease)    Heart murmur    past hx detected by one MD   Migraines    Multiple sclerosis ([place]) 05/29/2016   [facility] q 6 months.   Obesity (BMI 30-39.9) 01/15/2017   Pure hypercholesterolemia 05/29/2016   Vitamin D deficiency 05/29/2016    Past Surgical History:  Procedure Laterality Date   ABDOMINAL HYSTERECTOMY  2008   fibroids,  BSO   BREAST EXCISIONAL BIOPSY Left 2007   CESAREAN SECTION     CHOLECYSTECTOMY     COLONOSCOPY     MELANOMA EXCISION  1986   MOLE REMOVAL     UPPER GASTROINTESTINAL ENDOSCOPY      Outpatient Medications Prior to Visit  Medication Sig Dispense Refill   ALPRAZolam (XANAX) 0.5 MG tablet Take 1 tablet (0.5 mg total) by mouth daily as needed for anxiety (for MRI). 10 tablet 0   Ascorbic Acid (VITAMIN C) 500 MG/5ML LIQD      B Complex Vitamins (B COMPLEX 100 PO) Take 1 capsule by mouth daily.     baclofen (LIORESAL) 10 MG tablet Take 10 mg by  mouth 3 (three) times daily.     Biotin 10 MG CAPS 1 capsule Orally Once a day     Calcium Carb-Cholecalciferol (CALCIUM 1000 + D PO) Take 1 tablet by mouth daily.     Cholecalciferol (VITAMIN D3 PO) Take 1 capsule by mouth daily. (Patient not taking: Reported on 05/08/2022)     cholecalciferol (VITAMIN D3) 25 MCG (1000 UNIT) tablet      cyclobenzaprine (FLEXERIL) 5 MG tablet Take 1 tablet (5 mg total) by mouth 3 (three) times daily as needed for muscle spasms. 30 tablet 3   Digestive Enzyme CAPS Take by mouth. Every meal     ibuprofen (ADVIL) 200 MG tablet Take by mouth.     JUBLIA 10 % SOLN      Magnesium 500 MG TABS Take by mouth.     Probiotic Product (PROBIOTIC MATURE ADULT) CAPS Take by mouth.     topiramate (TOPAMAX) 25 MG tablet TAKE 1 TABLET BY MOUTH TWICE A DAY 180 tablet 2   Turmeric 500 MG CAPS Take by mouth.     Zinc 22.5 MG TABS  No facility-administered medications prior to visit.    Allergies  Allergen Reactions   Bee Venom Anaphylaxis, Itching and Swelling   Shellfish Allergy Shortness Of Breath   Interferons Other (See Comments)    Chest pains, burning trouble breathing itching   Acetaminophen Other (See Comments)    Elevates live enzymes   Codeine Nausea Only   Flagyl [Metronidazole] Other (See Comments)    Migraines    Gabapentin Hives   Gluten Meal Diarrhea and Other (See Comments)    Stomach pain    Interferon Alfa-2a Hives   Iodine    Lactose     Other reaction(s): Unknown   Lamotrigine Hives   Latex    Oseltamivir Other (See Comments)   ROS neg/noncontributory except as noted HPI/below HA daily in summer heat.  Mostly R side-can be knife-like.        Objective:     BP 100/68 (BP Location: Left Arm, Patient Position: Sitting)   Pulse 86   Temp 97.9 F (36.6 C) (Temporal)   Ht '5\' 6"'$  (1.676 m)   Wt 216 lb 12.8 oz (98.3 kg)   LMP  (LMP Unknown)   SpO2 97%   BMI 34.99 kg/m  Wt Readings from Last 3 Encounters:  05/08/22 216 lb 12.8 oz  (98.3 kg)  03/11/22 218 lb 6.4 oz (99.1 kg)  03/26/21 221 lb 9.6 oz (100.5 kg)    Physical Exam   Gen: WDWN NAD WF HEENT: NCAT, conjunctiva not injected, sclera nonicteric CARDIAC: RRR, S1S2+, no murmur.  EXT:  no edema MSK: cane. NEURO: A&O x3.  CN II-XII intact.  PSYCH: normal mood. Good eye contact     Assessment & Plan:   Problem List Items Addressed This Visit       Nervous and Auditory   Multiple sclerosis ([place]) - Primary   Other Visit Diagnoses     Educated about COVID-19 virus infection         1.  Multiple sclerosis-chronic.  Stable.  She sees [facility].  She will be getting dental work and they recommended Medrol Dosepak for preventative for a flare.  She is trying to time the dental work around getting a COVID-vaccine (which will make her sick).  Discussed that the Medrol will suppress the immune system, so may not get as robust a response to the vaccine.  She will take this into account for her timing of the 2. 2.  Questions about COVID-19-discussed availability of virtual visits if she does become COVID-positive.  Also advised of my chart visits.  Answered questions.  She will get the vaccine when she can time it with other events in her life because every time she gets sick, and knocks her down for 3 days.  Follow-up 1 year for annual physical and as needed  No orders of the defined types were placed in this encounter.   Wellington Hampshire, MD

## 2022-05-13 ENCOUNTER — Ambulatory Visit (INDEPENDENT_AMBULATORY_CARE_PROVIDER_SITE_OTHER): Payer: 59 | Admitting: Family Medicine

## 2022-05-13 ENCOUNTER — Encounter: Payer: Self-pay | Admitting: Family Medicine

## 2022-05-13 VITALS — BP 132/80 | HR 70 | Temp 97.9°F | Ht 66.0 in | Wt 217.1 lb

## 2022-05-13 DIAGNOSIS — M25522 Pain in left elbow: Secondary | ICD-10-CM | POA: Diagnosis not present

## 2022-05-13 DIAGNOSIS — M79605 Pain in left leg: Secondary | ICD-10-CM | POA: Diagnosis not present

## 2022-05-13 MED ORDER — METHYLPREDNISOLONE 4 MG PO TBPK: ORAL_TABLET | ORAL | 0 refills | Status: DC

## 2022-05-13 MED ORDER — DICLOFENAC SODIUM 1 % EX GEL: 4.0000 g | Freq: Four times a day (QID) | CUTANEOUS | 3 refills | Status: DC | PRN

## 2022-05-13 MED ORDER — ONDANSETRON HCL 4 MG PO TABS: 4.0000 mg | ORAL_TABLET | Freq: Three times a day (TID) | ORAL | 1 refills | Status: DC | PRN

## 2022-05-13 NOTE — Progress Notes (Signed)
Subjective:     Patient ID: Laura Maldonado, female    DOB: Aug 01, 1966, 56 y.o.   MRN: [id number]  Chief Complaint  Patient presents with   Pain    Pain in left arm, near elbow that started 2 or 3 days ago Tugging on left calf as well that started 2 or 3 days ago     HPI-here w/husb Pain L arm near elbow for 2-3 days-no injury.  No new exercise. Last pm, a lot of pain and hard to move.  L arm felt weaker few days ago while drying hair. Can hurt at rest.  "Feels bruised deep. Some tingle today down to hand. Pt R handed.  Feelt a warm last pm.  Took baclofen at 4am  Tugging L calf for 2-3 days. Like a squeeze.  Not worse to walk.  Hurts more to stretch it.   No obvious swelling.  Does have spider veins.    There are no preventive care reminders to display for this patient.  Past Medical History:  Diagnosis Date   Allergy    Anxiety    Cancer ([place])    melanoma-back   Chicken pox    Diverticulitis    Frequent headaches    GERD (gastroesophageal reflux disease)    Heart murmur    past hx detected by one MD   Migraines    Multiple sclerosis ([place]) 05/29/2016   [facility] q 6 months.   Obesity (BMI 30-39.9) 01/15/2017   Pure hypercholesterolemia 05/29/2016   Vitamin D deficiency 05/29/2016    Past Surgical History:  Procedure Laterality Date   ABDOMINAL HYSTERECTOMY  2008   fibroids,  BSO   BREAST EXCISIONAL BIOPSY Left 2007   CESAREAN SECTION     CHOLECYSTECTOMY     COLONOSCOPY     MELANOMA EXCISION  1986   MOLE REMOVAL     UPPER GASTROINTESTINAL ENDOSCOPY      Outpatient Medications Prior to Visit  Medication Sig Dispense Refill   ALPRAZolam (XANAX) 0.5 MG tablet Take 1 tablet (0.5 mg total) by mouth daily as needed for anxiety (for MRI). 10 tablet 0   Ascorbic Acid (VITAMIN C) 500 MG/5ML LIQD      B Complex Vitamins (B COMPLEX 100 PO) Take 1 capsule by mouth daily.     baclofen (LIORESAL) 10 MG tablet Take 10 mg by mouth 3 (three) times daily.     Biotin 10  MG CAPS 1 capsule Orally Once a day     Calcium Carb-Cholecalciferol (CALCIUM 1000 + D PO) Take 1 tablet by mouth daily.     cholecalciferol (VITAMIN D3) 25 MCG (1000 UNIT) tablet      cyclobenzaprine (FLEXERIL) 5 MG tablet Take 1 tablet (5 mg total) by mouth 3 (three) times daily as needed for muscle spasms. 30 tablet 3   Digestive Enzyme CAPS Take by mouth. Every meal     ibuprofen (ADVIL) 200 MG tablet Take by mouth.     JUBLIA 10 % SOLN      Magnesium 500 MG TABS Take by mouth.     Probiotic Product (PROBIOTIC MATURE ADULT) CAPS Take by mouth.     topiramate (TOPAMAX) 25 MG tablet TAKE 1 TABLET BY MOUTH TWICE A DAY 180 tablet 2   Turmeric 500 MG CAPS Take by mouth.     Zinc 22.5 MG TABS      Cholecalciferol (VITAMIN D3 PO) Take 1 capsule by mouth daily. (Patient not taking: Reported on 05/08/2022)  No facility-administered medications prior to visit.    Allergies  Allergen Reactions   Bee Venom Anaphylaxis, Itching and Swelling   Shellfish Allergy Shortness Of Breath and Other (See Comments)   Interferons Other (See Comments)    Chest pains, burning trouble breathing itching   Acetaminophen Other (See Comments)    Elevates live enzymes   Codeine Nausea Only and Other (See Comments)   Flagyl [Metronidazole] Other (See Comments)    Migraines    Gabapentin Hives and Other (See Comments)   Gluten Meal Diarrhea and Other (See Comments)    Stomach pain    Interferon Alfa-2a Hives   Iodine    Lactose     Other reaction(s): Unknown   Lamotrigine Hives   Latex    Oseltamivir Other (See Comments)   ROS neg/noncontributory except as noted HPI/below L ear, sound "shut off" intermitt.  And ringing.      Objective:     BP 132/80   Pulse 70   Temp 97.9 F (36.6 C) (Temporal)   Ht '5\' 6"'$  (1.676 m)   Wt 217 lb 2 oz (98.5 kg)   LMP  (LMP Unknown)   SpO2 97%   BMI 35.04 kg/m  Wt Readings from Last 3 Encounters:  05/13/22 217 lb 2 oz (98.5 kg)  05/08/22 216 lb 12.8 oz  (98.3 kg)  03/11/22 218 lb 6.4 oz (99.1 kg)    Physical Exam   Gen: WDWN NAD HEENT: NCAT, conjunctiva not injected, sclera nonicteric EXT:  no edema. Some spider veins MSK: L calf-no edema. Neg homan's.  Some tenderness lower part of muscle tendon-on R as well.  L elbow-no redness/edema.  +TTP medial epicondyle.  Pain w/pronation.  Some tenderness upper forearm.  Same on R as well.   MS 5/5 BUE.  NEURO: A&O x3.  CN II-XII intact.  PSYCH: normal mood. Good eye contact     Assessment & Plan:   Problem List Items Addressed This Visit   None Visit Diagnoses     Left elbow pain    -  Primary   Pain of left lower extremity          L elbow pain-R as well on exam.  Suspect tendonitis.  Voltaren gel, heat/ice.  Motrin 400-'800mg'$  tid.   If not resolving, do medrol dosepk(may be some flare MS L calf pain-seems more of tendon-motrin.  Monitor for edema, worse.    Meds ordered this encounter  Medications   diclofenac Sodium (VOLTAREN) 1 % GEL    Sig: Apply 4 g topically 4 (four) times daily as needed.    Dispense:  100 g    Refill:  3   ondansetron (ZOFRAN) 4 MG tablet    Sig: Take 1 tablet (4 mg total) by mouth every 8 (eight) hours as needed for nausea or vomiting.    Dispense:  20 tablet    Refill:  1   methylPREDNISolone (MEDROL DOSEPAK) 4 MG TBPK tablet    Sig: As directed    Dispense:  1 each    Refill:  0    Wellington Hampshire, MD

## 2022-05-13 NOTE — Patient Instructions (Addendum)
Ibuprofen 400- '800mg'$  three x/day  If not getting better-medrol dose pk.

## 2022-07-01 ENCOUNTER — Other Ambulatory Visit (HOSPITAL_BASED_OUTPATIENT_CLINIC_OR_DEPARTMENT_OTHER): Payer: Self-pay

## 2022-07-01 MED ORDER — COMIRNATY 30 MCG/0.3ML IM SUSY
PREFILLED_SYRINGE | INTRAMUSCULAR | 0 refills | Status: DC
  Filled 2022-07-01: qty 0.3, 1d supply, fill #0

## 2022-12-19 ENCOUNTER — Other Ambulatory Visit (HOSPITAL_BASED_OUTPATIENT_CLINIC_OR_DEPARTMENT_OTHER): Payer: Self-pay

## 2022-12-19 ENCOUNTER — Other Ambulatory Visit (HOSPITAL_BASED_OUTPATIENT_CLINIC_OR_DEPARTMENT_OTHER): Payer: Self-pay | Admitting: Family Medicine

## 2022-12-19 ENCOUNTER — Encounter (HOSPITAL_BASED_OUTPATIENT_CLINIC_OR_DEPARTMENT_OTHER): Payer: Self-pay | Admitting: Radiology

## 2022-12-19 ENCOUNTER — Ambulatory Visit (HOSPITAL_BASED_OUTPATIENT_CLINIC_OR_DEPARTMENT_OTHER)
Admission: RE | Admit: 2022-12-19 | Discharge: 2022-12-19 | Disposition: A | Discharge status: Home / Self Care | Payer: Medicare Other | Source: Ambulatory Visit | Attending: Family Medicine | Admitting: Family Medicine

## 2022-12-19 DIAGNOSIS — Z1231 Encounter for screening mammogram for malignant neoplasm of breast: Secondary | ICD-10-CM

## 2023-02-04 ENCOUNTER — Ambulatory Visit (INDEPENDENT_AMBULATORY_CARE_PROVIDER_SITE_OTHER): Payer: Medicare Other | Admitting: Family Medicine

## 2023-02-04 ENCOUNTER — Encounter: Payer: Self-pay | Admitting: Family Medicine

## 2023-02-04 VITALS — BP 122/70 | HR 72 | Temp 98.3°F | Resp 18 | Ht 66.0 in | Wt 221.4 lb

## 2023-02-04 DIAGNOSIS — F4323 Adjustment disorder with mixed anxiety and depressed mood: Secondary | ICD-10-CM

## 2023-02-04 MED ORDER — ESCITALOPRAM OXALATE 5 MG PO TABS: 5.0000 mg | ORAL_TABLET | Freq: Every day | ORAL | 0 refills | Status: DC

## 2023-02-04 NOTE — Progress Notes (Signed)
Subjective:     Patient ID: Laura Maldonado, female    DOB: Jun 23, 1966, 57 y.o.   MRN: [id number]  Chief Complaint  Patient presents with   Anxiety    Anxiety getting worse, discuss medication    HPI-here w/husb  Anxiety-getting worse-long time.  A lot going on.  Was on pred about 1 mo ago.  Doesn't go outdoors d/t heat and MS so feels trapped in house.  Limited as to what can do in heat and then overwhelmed and easily angered. Feels increased anx in body. Does a lot of self talk.  Past several months, irrit easily.  Hopeless, burned out.   Xanax for dental appts/MRI, etc.  No SI  Health Maintenance Due  Topic Date Due   Medicare Annual Wellness (AWV)  Never done    Past Medical History:  Diagnosis Date   Allergy    Anxiety    Cancer ([facility])    melanoma-back   Chicken pox    Diverticulitis    Frequent headaches    GERD (gastroesophageal reflux disease)    Heart murmur    past hx detected by one MD   Migraines    Multiple sclerosis ([facility]) 05/29/2016   [facility] q 6 months.   Obesity (BMI 30-39.9) 01/15/2017   Pure hypercholesterolemia 05/29/2016   Vitamin D deficiency 05/29/2016    Past Surgical History:  Procedure Laterality Date   ABDOMINAL HYSTERECTOMY  2008   fibroids,  BSO   BREAST EXCISIONAL BIOPSY Left 2007   CESAREAN SECTION     CHOLECYSTECTOMY     COLONOSCOPY     MELANOMA EXCISION  1986   MOLE REMOVAL     UPPER GASTROINTESTINAL ENDOSCOPY       Current Outpatient Medications:    ALPRAZolam (XANAX) 0.5 MG tablet, Take 1 tablet (0.5 mg total) by mouth daily as needed for anxiety (for MRI)., Disp: 10 tablet, Rfl: 0   Ascorbic Acid (VITAMIN C) 500 MG/5ML LIQD, , Disp: , Rfl:    B Complex Vitamins (B COMPLEX 100 PO), Take 1 capsule by mouth daily., Disp: , Rfl:    baclofen (LIORESAL) 10 MG tablet, Take 10 mg by mouth as needed., Disp: , Rfl:    cyclobenzaprine (FLEXERIL) 5 MG tablet, Take 1 tablet (5 mg total) by mouth 3 (three) times daily as needed  for muscle spasms., Disp: 30 tablet, Rfl: 3   Digestive Enzyme CAPS, Take by mouth. Every meal, Disp: , Rfl:    escitalopram (LEXAPRO) 5 MG tablet, Take 1 tablet (5 mg total) by mouth daily., Disp: 30 tablet, Rfl: 0   ibuprofen (ADVIL) 200 MG tablet, Take by mouth., Disp: , Rfl:    JUBLIA 10 % SOLN, , Disp: , Rfl:    Magnesium 500 MG TABS, Take by mouth., Disp: , Rfl:    ondansetron (ZOFRAN) 4 MG tablet, Take 1 tablet (4 mg total) by mouth every 8 (eight) hours as needed for nausea or vomiting., Disp: 20 tablet, Rfl: 1   Probiotic Product (PROBIOTIC MATURE ADULT) CAPS, Take by mouth., Disp: , Rfl:    topiramate (TOPAMAX) 25 MG tablet, TAKE 1 TABLET BY MOUTH TWICE A DAY, Disp: 180 tablet, Rfl: 2   Turmeric 500 MG CAPS, Take by mouth., Disp: , Rfl:    Zinc 22.5 MG TABS, , Disp: , Rfl:    Cholecalciferol (VITAMIN D3 PO), Take 1 capsule by mouth daily. (Patient not taking: Reported on 05/08/2022), Disp: , Rfl:   Allergies  Allergen Reactions  Bee Venom Anaphylaxis, Itching and Swelling   Shellfish Allergy Shortness Of Breath and Other (See Comments)   Interferons Other (See Comments)    Chest pains, burning trouble breathing itching   Acetaminophen Other (See Comments)    Elevates live enzymes   Codeine Nausea Only and Other (See Comments)   Flagyl [Metronidazole] Other (See Comments)    Migraines    Gabapentin Hives and Other (See Comments)   Gluten Meal Diarrhea and Other (See Comments)    Stomach pain    Interferon Alfa-2a Hives   Iodine    Lactose     Other reaction(s): Unknown   Lamotrigine Hives   Latex    Oseltamivir Other (See Comments)   ROS neg/noncontributory except as noted HPI/below      Objective:     BP 122/70   Pulse 72   Temp 98.3 F (36.8 C) (Temporal)   Resp 18   Ht 5\' 6"  (1.676 m)   Wt 221 lb 6 oz (100.4 kg)   LMP  (LMP Unknown)   SpO2 97%   BMI 35.73 kg/m  Wt Readings from Last 3 Encounters:  02/04/23 221 lb 6 oz (100.4 kg)  05/13/22 217 lb 2 oz  (98.5 kg)  05/08/22 216 lb 12.8 oz (98.3 kg)    Physical Exam   Gen: WDWN NAD HEENT: NCAT, conjunctiva not injected, sclera nonicteric CARDIAC: RRR, S1S2+, no murmur.  LUNGS: CTAB. No wheezes MSK: no gross abnormalities.  NEURO: A&O x3.  CN II-XII intact.  PSYCH: normal mood, teary eyed at times. Good eye contact     Assessment & Plan:  Adjustment disorder with mixed anxiety and depressed mood  Other orders -     Escitalopram Oxalate; Take 1 tablet (5 mg total) by mouth daily.  Dispense: 30 tablet; Refill: 0   Mixed adjustment disorder-chronic, intermitt but worsening.   Will tx w/lexapro 5mg  daily(pt sensitive to meds).  SED.  F/u 1 mo.     Return in about 4 weeks (around 03/04/2023) for mood-virtual.  Angelena Sole, MD

## 2023-02-04 NOTE — Patient Instructions (Signed)
Lexapro 1/2 tab daily for 1 wk then whole tab.

## 2023-02-23 ENCOUNTER — Encounter: Payer: Self-pay | Admitting: Family Medicine

## 2023-02-23 ENCOUNTER — Telehealth (INDEPENDENT_AMBULATORY_CARE_PROVIDER_SITE_OTHER): Payer: Medicare Other | Admitting: Family Medicine

## 2023-02-23 DIAGNOSIS — F4323 Adjustment disorder with mixed anxiety and depressed mood: Secondary | ICD-10-CM | POA: Diagnosis not present

## 2023-02-23 MED ORDER — ESCITALOPRAM OXALATE 5 MG PO TABS: 5.0000 mg | ORAL_TABLET | Freq: Every day | ORAL | 1 refills | Status: DC

## 2023-02-23 NOTE — Patient Instructions (Signed)
So glad you are doing better.   Let me know if having new problems, need to adjust dose, etc.

## 2023-02-23 NOTE — Progress Notes (Signed)
MyChart Video Visit Virtual Visit via Video Note   This visit type was conducted w/patient consent. This format is felt to be most appropriate for this patient at this time. Physical exam was limited by quality of the video and audio technology used for the visit. CMA was able to get the patient set up on a video visit.  Patient location: Home. Patient and provider in visit Provider location: Office  I discussed the limitations of evaluation and management by telemedicine and the availability of in person appointments. The patient expressed understanding and agreed to proceed.  Visit Date: 02/23/2023  Today's healthcare provider: Angelena Sole, MD     Subjective:    Patient ID: Laura Maldonado, female    DOB: Jun 23, 1966, 57 y.o.   MRN: [id number]  Chief Complaint  Patient presents with   Medication Follow-up    Follow-up on moods/lexapro    HPI Pt is present for video visit.  Husband present as well. Anxiety- Lexapro 5mg  daily-some improvement.  Feels more "regulated".  More tolerant.  Some occ loose stools. Some nausea-all tolerable, dizziness.   No SI  Past Medical History:  Diagnosis Date   Allergy    Anxiety    Cancer ([facility])    melanoma-back   Chicken pox    Diverticulitis    Frequent headaches    GERD (gastroesophageal reflux disease)    Heart murmur    past hx detected by one MD   Migraines    Multiple sclerosis ([facility]) 05/29/2016   [facility] q 6 months.   Obesity (BMI 30-39.9) 01/15/2017   Pure hypercholesterolemia 05/29/2016   Vitamin D deficiency 05/29/2016    Past Surgical History:  Procedure Laterality Date   ABDOMINAL HYSTERECTOMY  2008   fibroids,  BSO   BREAST EXCISIONAL BIOPSY Left 2007   CESAREAN SECTION     CHOLECYSTECTOMY     COLONOSCOPY     MELANOMA EXCISION  1986   MOLE REMOVAL     UPPER GASTROINTESTINAL ENDOSCOPY      Outpatient Medications Prior to Visit  Medication Sig Dispense Refill   ALPRAZolam (XANAX) 0.5 MG tablet Take 1  tablet (0.5 mg total) by mouth daily as needed for anxiety (for MRI). 10 tablet 0   Ascorbic Acid (VITAMIN C) 500 MG/5ML LIQD      B Complex Vitamins (B COMPLEX 100 PO) Take 1 capsule by mouth daily.     baclofen (LIORESAL) 10 MG tablet Take 10 mg by mouth as needed.     Cholecalciferol (VITAMIN D3 PO) Take 1 capsule by mouth daily.     cyclobenzaprine (FLEXERIL) 5 MG tablet Take 1 tablet (5 mg total) by mouth 3 (three) times daily as needed for muscle spasms. 30 tablet 3   Digestive Enzyme CAPS Take by mouth. Every meal     ibuprofen (ADVIL) 200 MG tablet Take by mouth.     JUBLIA 10 % SOLN      Magnesium 500 MG TABS Take by mouth.     ondansetron (ZOFRAN) 4 MG tablet Take 1 tablet (4 mg total) by mouth every 8 (eight) hours as needed for nausea or vomiting. 20 tablet 1   Probiotic Product (PROBIOTIC MATURE ADULT) CAPS Take by mouth.     topiramate (TOPAMAX) 25 MG tablet TAKE 1 TABLET BY MOUTH TWICE A DAY 180 tablet 2   Turmeric 500 MG CAPS Take by mouth.     Zinc 22.5 MG TABS      escitalopram (LEXAPRO) 5 MG  tablet Take 1 tablet (5 mg total) by mouth daily. 30 tablet 0   No facility-administered medications prior to visit.    Allergies  Allergen Reactions   Bee Venom Anaphylaxis, Itching and Swelling   Shellfish Allergy Shortness Of Breath and Other (See Comments)   Interferons Other (See Comments)    Chest pains, burning trouble breathing itching   Acetaminophen Other (See Comments)    Elevates live enzymes   Codeine Nausea Only and Other (See Comments)   Flagyl [Metronidazole] Other (See Comments)    Migraines    Gabapentin Hives and Other (See Comments)   Gluten Meal Diarrhea and Other (See Comments)    Stomach pain    Interferon Alfa-2a Hives   Iodine    Lactose     Other reaction(s): Unknown   Lamotrigine Hives   Latex    Oseltamivir Other (See Comments)        Objective:     Physical Exam  Vitals and nursing note reviewed.  Constitutional:      General:  is  not in acute distress.    Appearance: Normal appearance.  HENT:     Head: Normocephalic.  Pulmonary:     Effort: No respiratory distress.  Skin:    General: Skin is dry.     Coloration: Skin is not pale.  Neurological:     Mental Status: Pt is alert and oriented to person, place, and time.  Psychiatric:        Mood and Affect: Mood normal.   LMP  (LMP Unknown)   Wt Readings from Last 3 Encounters:  02/04/23 221 lb 6 oz (100.4 kg)  05/13/22 217 lb 2 oz (98.5 kg)  05/08/22 216 lb 12.8 oz (98.3 kg)       Assessment & Plan:   Problem List Items Addressed This Visit   None Visit Diagnoses     Adjustment disorder with mixed anxiety and depressed mood    -  Primary      Adjustment disorder-mixed.  Doing much better on lexapro 5mg  daily.  Still some lingering SE-pt sensitive to meds.  Will continue on 5mg  daily.  Will let us know if needs increase to 10mg  but suspect 5mg  is doing well.     Has f/u annual in Oct.  Meds ordered this encounter  Medications   escitalopram (LEXAPRO) 5 MG tablet    Sig: Take 1 tablet (5 mg total) by mouth daily.    Dispense:  90 tablet    Refill:  1    I discussed the assessment and treatment plan with the patient. The patient was provided an opportunity to ask questions and all were answered. The patient agreed with the plan and demonstrated an understanding of the instructions.   The patient was advised to call back or seek an in-person evaluation if the symptoms worsen or if the condition fails to improve as anticipated.  No follow-ups on file.  Angelena Sole, MD [facility] [phone] (phone) [phone] (fax)  [facility]    I,Rachel Rivera,acting as a scribe for Angelena Sole, MD.,have documented all relevant documentation on the behalf of Angelena Sole, MD,as directed by  Angelena Sole, MD while in the presence of Angelena Sole, MD.  I, Angelena Sole, MD, have reviewed all documentation for this visit.  The documentation on 02/23/23 for the exam, diagnosis, procedures, and orders are all accurate and complete.

## 2023-02-24 ENCOUNTER — Telehealth: Payer: Medicare Other | Admitting: Family Medicine

## 2023-03-10 ENCOUNTER — Other Ambulatory Visit: Payer: Self-pay | Admitting: Family Medicine

## 2023-03-10 ENCOUNTER — Encounter: Payer: Self-pay | Admitting: Family Medicine

## 2023-03-10 MED ORDER — ESCITALOPRAM OXALATE 10 MG PO TABS: 10.0000 mg | ORAL_TABLET | Freq: Every day | ORAL | 1 refills | Status: DC

## 2023-03-13 ENCOUNTER — Telehealth: Payer: Self-pay | Admitting: Family Medicine

## 2023-03-13 ENCOUNTER — Encounter: Payer: Self-pay | Admitting: Family Medicine

## 2023-03-13 NOTE — Telephone Encounter (Signed)
See previews note 

## 2023-03-13 NOTE — Telephone Encounter (Signed)
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 She needs some advise on what to look for and what to do. If they can't be reached by phone, she would like a message through Mychart. Please advise.

## 2023-03-13 NOTE — Telephone Encounter (Signed)
Spoke with patient, advise to go to UC if has any symptoms or symptoms worsen  should drink plenty of fluids.

## 2023-05-20 ENCOUNTER — Encounter: Payer: Self-pay | Admitting: Family Medicine

## 2023-05-20 ENCOUNTER — Ambulatory Visit (INDEPENDENT_AMBULATORY_CARE_PROVIDER_SITE_OTHER): Payer: Medicare Other | Admitting: Family Medicine

## 2023-05-20 VITALS — BP 119/81 | HR 77 | Temp 98.0°F | Resp 18 | Ht 66.0 in | Wt 224.0 lb

## 2023-05-20 DIAGNOSIS — E559 Vitamin D deficiency, unspecified: Secondary | ICD-10-CM

## 2023-05-20 DIAGNOSIS — G35 Multiple sclerosis: Secondary | ICD-10-CM

## 2023-05-20 DIAGNOSIS — R739 Hyperglycemia, unspecified: Secondary | ICD-10-CM

## 2023-05-20 DIAGNOSIS — R519 Headache, unspecified: Secondary | ICD-10-CM | POA: Diagnosis not present

## 2023-05-20 DIAGNOSIS — F4323 Adjustment disorder with mixed anxiety and depressed mood: Secondary | ICD-10-CM | POA: Diagnosis not present

## 2023-05-20 DIAGNOSIS — Z79899 Other long term (current) drug therapy: Secondary | ICD-10-CM | POA: Diagnosis not present

## 2023-05-20 DIAGNOSIS — E7800 Pure hypercholesterolemia, unspecified: Secondary | ICD-10-CM

## 2023-05-20 DIAGNOSIS — G8929 Other chronic pain: Secondary | ICD-10-CM

## 2023-05-20 LAB — COMPREHENSIVE METABOLIC PANEL
ALT: 16 U/L (ref 0–35)
AST: 21 U/L (ref 0–37)
Albumin: 4.2 g/dL (ref 3.5–5.2)
Alkaline Phosphatase: 80 U/L (ref 39–117)
BUN: 12 mg/dL (ref 6–23)
CO2: 25 meq/L (ref 19–32)
Calcium: 9.5 mg/dL (ref 8.4–10.5)
Chloride: 105 meq/L (ref 96–112)
Creatinine, Ser: 0.58 mg/dL (ref 0.40–1.20)
GFR: 99.94 mL/min (ref >60.00)
Glucose, Bld: 97 mg/dL (ref 70–99)
Potassium: 4 meq/L (ref 3.5–5.1)
Sodium: 139 meq/L (ref 135–145)
Total Bilirubin: 0.5 mg/dL (ref 0.2–1.2)
Total Protein: 7.1 g/dL (ref 6.0–8.3)

## 2023-05-20 LAB — CBC WITH DIFFERENTIAL/PLATELET
Basophils Absolute: 0.1 10*3/uL (ref 0.0–0.1)
Basophils Relative: 1 % (ref 0.0–3.0)
Eosinophils Absolute: 0.1 10*3/uL (ref 0.0–0.7)
Eosinophils Relative: 1.2 % (ref 0.0–5.0)
HCT: 41.2 % (ref 36.0–46.0)
Hemoglobin: 13.7 g/dL (ref 12.0–15.0)
Lymphocytes Relative: 29.6 % (ref 12.0–46.0)
Lymphs Abs: 1.9 10*3/uL (ref 0.7–4.0)
MCHC: 33.1 g/dL (ref 30.0–36.0)
MCV: 94.6 fL (ref 78.0–100.0)
Monocytes Absolute: 0.4 10*3/uL (ref 0.1–1.0)
Monocytes Relative: 6.8 % (ref 3.0–12.0)
Neutro Abs: 3.9 10*3/uL (ref 1.4–7.7)
Neutrophils Relative %: 61.4 % (ref 43.0–77.0)
Platelets: 293 10*3/uL (ref 150.0–400.0)
RBC: 4.36 Mil/uL (ref 3.87–5.11)
RDW: 13.5 % (ref 11.5–15.5)
WBC: 6.4 10*3/uL (ref 4.0–10.5)

## 2023-05-20 LAB — LIPID PANEL
Cholesterol: 218 mg/dL — ABNORMAL HIGH (ref 0–200)
HDL: 75.1 mg/dL (ref >39.00)
LDL Cholesterol: 124 mg/dL — ABNORMAL HIGH (ref 0–99)
NonHDL: 143.1
Total CHOL/HDL Ratio: 3
Triglycerides: 94 mg/dL (ref 0.0–149.0)
VLDL: 18.8 mg/dL (ref 0.0–40.0)

## 2023-05-20 LAB — HEMOGLOBIN A1C: Hgb A1c MFr Bld: 5.5 % (ref 4.6–6.5)

## 2023-05-20 LAB — VITAMIN D 25 HYDROXY (VIT D DEFICIENCY, FRACTURES): VITD: 32.11 ng/mL (ref 30.00–100.00)

## 2023-05-20 LAB — TSH: TSH: 1.47 u[IU]/mL (ref 0.35–5.50)

## 2023-05-20 MED ORDER — TOPIRAMATE 25 MG PO TABS: 25.0000 mg | ORAL_TABLET | Freq: Two times a day (BID) | ORAL | 3 refills | Status: DC

## 2023-05-20 MED ORDER — ESCITALOPRAM OXALATE 5 MG PO TABS: 5.0000 mg | ORAL_TABLET | Freq: Every day | ORAL | 3 refills | Status: DC

## 2023-05-20 NOTE — Assessment & Plan Note (Signed)
Has MS.  Controlled on topamax 25mg  bid

## 2023-05-20 NOTE — Patient Instructions (Signed)

## 2023-05-20 NOTE — Assessment & Plan Note (Signed)
Chronic.  Mostly controlled on lexapro 5mg  daily, 10mg  caused SE.

## 2023-05-20 NOTE — Assessment & Plan Note (Signed)
Chronic.  Stable.  Waxes and waning symptoms.  Managed by neuro.  Intol several meds

## 2023-05-20 NOTE — Assessment & Plan Note (Signed)
Chronic.  Controlled on supps.  Check levels

## 2023-05-20 NOTE — Progress Notes (Signed)
Phone [phone]   Subjective:   Patient is a 57 y.o. female presenting for annual physical.    Chief Complaint  Patient presents with   Annual Exam    CPE Fasting    Annual -  Is attempting to walk everyday, but finds it difficult due to the humidity. Finds ways to get in extra steps everyday. Endorses anxiety around joining a gym or a workout program.   Multiple Sclerosis - States that she has not experienced any major flare ups. Endorses management, but no improvement.  Moods/ Anxiety - Is responding well to splitting 10 mg Lexapro in half daily. No SI.    Dizziness - Endorses intermittent dizziness. She does not know what to attribute her dizziness to.    See problem oriented charting- ROS- ROS: Gen: no fever, chills  Skin: no rash, itching ENT: no ear pain, ear drainage, nasal congestion, rhinorrhea, sinus pressure, sore throat Eyes: no blurry vision, double vision Resp: no cough, wheeze,SOB CV: no palpitations, LE edema, +CP-intermitt.  Not know pattern GI: no heartburn, n/v/c, abd pain, +diarrhea - intermitt.  Nothing new GU: no dysuria, urgency, frequency, hematuria MSK: , myalgias, back pain +joint pain-chronic Neuro: headache, weakness, vertigo, +dizziness-intermitt, chronic Psych: no depression, insomnia, SI, +anxiety  The following were reviewed and entered/updated in epic: Past Medical History:  Diagnosis Date   Allergy    Anxiety    Cancer (HCC)    melanoma-back   Chicken pox    Diverticulitis    Frequent headaches    GERD (gastroesophageal reflux disease)    Heart murmur    past hx detected by one MD   Migraines    Multiple sclerosis (HCC) 05/29/2016   [facility] q 6 months.   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   Melanoma of skin (HCC) 08/22/2019   Diverticulosis 08/22/2019   Chronic nonintractable headache 03/08/2017   Moderate episode of recurrent major depressive disorder (HCC) 03/08/2017   Vitamin D deficiency 05/29/2016   Pure hypercholesterolemia 05/29/2016   Multiple sclerosis (HCC) 05/29/2016   Past Surgical History:  Procedure Laterality Date   ABDOMINAL HYSTERECTOMY  2008   fibroids,  BSO   BREAST EXCISIONAL BIOPSY Left 2007   BREAST SURGERY  2007   left lumpectomy   CESAREAN SECTION     CHOLECYSTECTOMY     COLONOSCOPY     FRACTURE SURGERY  1972/2001   left leg/rt ankle   MELANOMA EXCISION  1986   MOLE REMOVAL     UPPER GASTROINTESTINAL ENDOSCOPY      Family History  Problem Relation Age of Onset   Hypertension Mother    Cancer Mother    Stroke Mother    Heart disease Father    Early death Father    Hypertension Maternal Grandmother    Stroke Maternal Grandmother    Hypertension Maternal Grandfather    Hypertension Paternal Grandmother    Hypertension Paternal Grandfather    Heart disease Paternal Grandfather    Colon cancer Neg Hx    Colon polyps Neg Hx    Esophageal cancer Neg Hx    Rectal cancer Neg Hx    Stomach cancer Neg Hx     Medications-  reviewed and updated Current Outpatient Medications  Medication Sig Dispense Refill   ALPRAZolam (XANAX) 0.5 MG tablet Take 1 tablet (0.5 mg total) by mouth daily as needed for anxiety (for MRI). 10 tablet 0   Ascorbic Acid (VITAMIN C) 500 MG/5ML LIQD      B Complex Vitamins (B COMPLEX 100 PO) Take 1 capsule by mouth daily.     baclofen (LIORESAL) 10 MG tablet Take 10 mg by mouth as needed.     Cholecalciferol (VITAMIN D3 PO) Take 1 capsule by mouth daily.     cyclobenzaprine (FLEXERIL) 5 MG tablet Take 1 tablet (5 mg total) by mouth 3 (three) times daily as needed for muscle spasms. 30 tablet 3   Digestive Enzyme CAPS Take by  mouth. Every meal     ibuprofen (ADVIL) 200 MG tablet Take by mouth.     JUBLIA 10 % SOLN      Magnesium 500 MG TABS Take by mouth.     ondansetron (ZOFRAN) 4 MG tablet Take 1 tablet (4 mg total) by mouth every 8 (eight) hours as needed for nausea or vomiting. 20 tablet 1   Probiotic Product (PROBIOTIC MATURE ADULT) CAPS Take by mouth.     Turmeric 500 MG CAPS Take by mouth.     Zinc 22.5 MG TABS      escitalopram (LEXAPRO) 5 MG tablet Take 1 tablet (5 mg total) by mouth daily. 90 tablet 3   topiramate (TOPAMAX) 25 MG tablet Take 1 tablet (25 mg total) by mouth 2 (two) times daily. 180 tablet 3   No current facility-administered medications for this visit.    Allergies-reviewed and updated Allergies  Allergen Reactions   Bee Venom Anaphylaxis, Itching and Swelling   Shellfish Allergy Shortness Of Breath and Other (See Comments)   Interferons Other (See Comments)    Chest pains, burning trouble breathing itching   Acetaminophen Other (See Comments)    Elevates live enzymes   Codeine Nausea Only and Other (See Comments)   Flagyl [Metronidazole] Other (See Comments)    Migraines    Gabapentin Hives and Other (See Comments)   Gluten Meal Diarrhea and Other (See Comments)    Stomach pain    Interferon Alfa-2a Hives   Iodine    Lactose     Other reaction(s): Unknown   Lamotrigine Hives   Latex    Oseltamivir Other (See Comments)    Social History   Social History Narrative   Disabled since 2002   Objective  Objective:  BP 119/81   Pulse 77   Temp 98 F (36.7 C) (Temporal)   Resp 18   Ht 5\' 6"  (1.676 m)   Wt 224 lb (101.6 kg)   LMP  (LMP Unknown)   SpO2 97%   BMI 36.15 kg/m  Physical Exam  Gen: WDWN NAD HEENT: NCAT, conjunctiva not injected, sclera nonicteric TM WNL B, OP moist, no exudates  NECK:  supple, no thyromegaly, no nodes, no carotid bruits CARDIAC: RRR, S1S2+, no murmur. DP 2+B LUNGS: CTAB. No wheezes ABDOMEN:  BS+, soft, No HSM, no masses, +TTP EXT:   no edema MSK: no gross abnormalities. MS 5/5 all 4 NEURO: A&O x3.  CN II-XII intact.  PSYCH: normal mood. Good eye contact    Assessment and Plan   Health Maintenance counseling: 1. Anticipatory guidance: Patient counseled regarding regular dental exams q6 months, eye exams,  avoiding smoking and second hand smoke, limiting alcohol to 1 beverage per day, no illicit drugs.  2. Risk factor reduction:  Advised patient of need for regular exercise and diet rich and fruits and vegetables to reduce risk of heart attack and stroke.  Exercise- Is attempting to walk everyday.  Wt Readings from Last 3 Encounters:  05/20/23 224 lb (101.6 kg)  02/04/23 221 lb 6 oz (100.4 kg)  05/13/22 217 lb 2 oz (98.5 kg)   3. Immunizations/screenings/ancillary studies Immunization History  Administered Date(s) Administered   Influenza,inj,Quad PF,6+ Mos 05/08/2019   Influenza-Unspecified 05/04/2020, 06/12/2021, 04/27/2022   PFIZER Comirnaty(Gray Top)Covid-19 Tri-Sucrose Vaccine 06/19/2020   PFIZER(Purple Top)SARS-COV-2 Vaccination 11/05/2019, 11/26/2019, 06/05/2020   Pfizer Covid Bivalent Pediatric Vaccine(67mos to <9yrs) 06/26/2021   Pfizer(Comirnaty)Fall Seasonal Vaccine 12 years and older 07/01/2022   Tdap 06/13/2013   Health Maintenance Due  Topic Date Due   Medicare Annual Wellness (AWV)  Never done    4. Cervical cancer screening- N/A 5. Breast cancer screening-  mammogram utd, May 2024 6. Colon cancer screening - utd,  2018 7. Skin cancer screening- advised regular sunscreen use. Denies worrisome, changing, or new skin lesions.  8. Birth control/STD check- surgical 9. Osteoporosis screening- 2023 10. Smoking associated screening - non smoker  Multiple sclerosis (HCC) Assessment & Plan: Chronic.  Stable.  Waxes and waning symptoms.  Managed by neuro.  Intol several meds  Orders: -     CBC with Differential/Platelet -     Comprehensive metabolic panel  Chronic nonintractable headache,  unspecified headache type Assessment & Plan: Has MS.  Controlled on topamax 25mg  bid  Orders: -     Topiramate; Take 1 tablet (25 mg total) by mouth 2 (two) times daily.  Dispense: 180 tablet; Refill: 3  Adjustment disorder with mixed anxiety and depressed mood Assessment & Plan: Chronic.  Mostly controlled on lexapro 5mg  daily, 10mg  caused SE.  Orders: -     Escitalopram Oxalate; Take 1 tablet (5 mg total) by mouth daily.  Dispense: 90 tablet; Refill: 3  Vitamin D deficiency Assessment & Plan: Chronic.  Controlled on supps.  Check levels  Orders: -     VITAMIN D 25 Hydroxy (Vit-D Deficiency, Fractures)  Pure hypercholesterolemia Assessment & Plan: Chronic.  LDL slightly above goal <130.  Work on diet and sch exercise daily  Orders: -     Lipid panel -     TSH  High risk medication use -     CBC with Differential/Platelet -     Comprehensive metabolic panel  Hyperglycemia -     Hemoglobin A1c    Recommended follow up: Return in about 1 year (around 05/19/2024) for annual physical.  Lab/Order associations: fasting   I,Anaiya N Rice,acting as a scribe for Angelena Sole, MD.,have documented all relevant documentation on the behalf of Angelena Sole, MD,as directed by  Angelena Sole, MD while in the presence of Angelena Sole, MD.  I, Angelena Sole, MD, have reviewed all documentation for this visit. The documentation on 05/20/23 for the exam, diagnosis, procedures, and orders are all accurate and complete.   Angelena Sole, MD

## 2023-05-20 NOTE — Assessment & Plan Note (Signed)
Chronic.  LDL slightly above goal <130.  Work on diet and sch exercise daily

## 2023-05-24 NOTE — Progress Notes (Signed)
Labs look good!  Continue working on diet and exercise the best you can

## 2023-10-10 ENCOUNTER — Telehealth: Payer: Medicare Other | Admitting: Physician Assistant

## 2023-10-10 DIAGNOSIS — S46911A Strain of unspecified muscle, fascia and tendon at shoulder and upper arm level, right arm, initial encounter: Secondary | ICD-10-CM

## 2023-10-10 DIAGNOSIS — G35 Multiple sclerosis: Secondary | ICD-10-CM | POA: Diagnosis not present

## 2023-10-10 MED ORDER — BACLOFEN 10 MG PO TABS: 10.0000 mg | ORAL_TABLET | ORAL | 0 refills | Status: AC | PRN

## 2023-10-10 NOTE — Patient Instructions (Signed)
 Malena Catholic, thank you for joining [name], PA-C for today's virtual visit.  While this provider is not your primary care provider (PCP), if your PCP is located in our provider database this encounter information will be shared with them immediately following your visit.   A [facility] MyChart account gives you access to today's visit and all your visits, tests, and labs performed at [facility] " click here if you don't have a [facility] MyChart account or go to [email or web address]  Consent: (Patient) Laura Maldonado provided verbal consent for this virtual visit at the beginning of the encounter.  Current Medications:  Current Outpatient Medications:    ALPRAZolam (XANAX) 0.5 MG tablet, Take 1 tablet (0.5 mg total) by mouth daily as needed for anxiety (for MRI)., Disp: 10 tablet, Rfl: 0   Ascorbic Acid (VITAMIN C) 500 MG/5ML LIQD, , Disp: , Rfl:    B Complex Vitamins (B COMPLEX 100 PO), Take 1 capsule by mouth daily., Disp: , Rfl:    baclofen (LIORESAL) 10 MG tablet, Take 10 mg by mouth as needed., Disp: , Rfl:    Cholecalciferol (VITAMIN D3 PO), Take 1 capsule by mouth daily., Disp: , Rfl:    cyclobenzaprine (FLEXERIL) 5 MG tablet, Take 1 tablet (5 mg total) by mouth 3 (three) times daily as needed for muscle spasms., Disp: 30 tablet, Rfl: 3   Digestive Enzyme CAPS, Take by mouth. Every meal, Disp: , Rfl:    escitalopram (LEXAPRO) 5 MG tablet, Take 1 tablet (5 mg total) by mouth daily., Disp: 90 tablet, Rfl: 3   ibuprofen (ADVIL) 200 MG tablet, Take by mouth., Disp: , Rfl:    JUBLIA 10 % SOLN, , Disp: , Rfl:    Magnesium 500 MG TABS, Take by mouth., Disp: , Rfl:    ondansetron (ZOFRAN) 4 MG tablet, Take 1 tablet (4 mg total) by mouth every 8 (eight) hours as needed for nausea or vomiting., Disp: 20 tablet, Rfl: 1   Probiotic Product (PROBIOTIC MATURE ADULT) CAPS, Take by mouth., Disp: , Rfl:    topiramate (TOPAMAX) 25 MG tablet, Take 1 tablet (25 mg  total) by mouth 2 (two) times daily., Disp: 180 tablet, Rfl: 3   Turmeric 500 MG CAPS, Take by mouth., Disp: , Rfl:    Zinc 22.5 MG TABS, , Disp: , Rfl:    Medications ordered in this encounter:  No orders of the defined types were placed in this encounter.    *If you need refills on other medications prior to your next appointment, please contact your pharmacy*  Follow-Up: Call back or seek an in-person evaluation if the symptoms worsen or if the condition fails to improve as anticipated.  [facility] [phone]  Other Instructions Please report to the nearest Emergency room with any worsening symptoms. Follow up with primary care provider (PCP) in 2 -3 days.    If you have been instructed to have an in-person evaluation today at a local Urgent Care facility, please use the link below. It will take you to a list of all of our available [facility], including address, phone number and hours of operation. Please do not delay care.  [facility]  If you or a family member do not have a primary care provider, use the link below to schedule a visit and establish care. When you choose a [facility] primary care physician or advanced practice provider, you gain a long-term partner in health. Find  a Primary Care Provider  Learn more about [facility] in-office and virtual care options: [facility] - Get Care Now

## 2023-10-10 NOTE — Progress Notes (Signed)
 Virtual Visit Consent   Laura Maldonado, you are scheduled for a virtual visit with a [facility] provider today. Just as with appointments in the office, your consent must be obtained to participate. Your consent will be active for this visit and any virtual visit you may have with one of our providers in the next 365 days. If you have a MyChart account, a copy of this consent can be sent to you electronically.  As this is a virtual visit, video technology does not allow for your provider to perform a traditional examination. This may limit your provider's ability to fully assess your condition. If your provider identifies any concerns that need to be evaluated in person or the need to arrange testing (such as labs, EKG, etc.), we will make arrangements to do so. Although advances in technology are sophisticated, we cannot ensure that it will always work on either your end or our end. If the connection with a video visit is poor, the visit may have to be switched to a telephone visit. With either a video or telephone visit, we are not always able to ensure that we have a secure connection.  By engaging in this virtual visit, you consent to the provision of healthcare and authorize for your insurance to be billed (if applicable) for the services provided during this visit. Depending on your insurance coverage, you may receive a charge related to this service.  I need to obtain your verbal consent now. Are you willing to proceed with your visit today? Laura Maldonado has provided verbal consent on 10/10/2023 for a virtual visit (video or telephone). Laura Maldonado, [place]  Date: 10/10/2023 5:34 PM   Virtual Visit via Video Note   I, Laura Maldonado, connected with  Laura Maldonado  ([id number], 04-16-1966) on 10/10/23 at  5:30 PM EST by a video-enabled telemedicine application and verified that I am speaking with the correct person using two identifiers.  Location: Patient: Virtual Visit  Location Patient: Home Provider: Virtual Visit Location Provider: Home Office   I discussed the limitations of evaluation and management by telemedicine and the availability of in person appointments. The patient expressed understanding and agreed to proceed.    History of Present Illness: Laura Maldonado is a 58 y.o. who identifies as a female who was assigned female at birth, and is being seen today for right shoulder pain.  HPI: Shoulder Pain  The pain is present in the right shoulder. This is a recurrent problem. The current episode started yesterday. There has been no history of extremity trauma. The quality of the pain is described as aching. The pain is mild. Associated symptoms include stiffness. Pertinent negatives include no fever, inability to bear weight or itching.    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   Melanoma of skin (HCC) 08/22/2019   Diverticulosis 08/22/2019   Chronic nonintractable headache 03/08/2017   Moderate episode of recurrent major depressive disorder (HCC) 03/08/2017   Vitamin D deficiency 05/29/2016   Pure hypercholesterolemia 05/29/2016   Multiple sclerosis (HCC) 05/29/2016    Allergies:  Allergies  Allergen Reactions   Bee Venom Anaphylaxis, Itching and Swelling   Shellfish Allergy Shortness Of Breath and Other (See Comments)   Interferons Other (See Comments)    Chest pains, burning trouble breathing itching   Acetaminophen  Other (See Comments)    Elevates live enzymes   Codeine Nausea Only and Other (See Comments)   Flagyl [Metronidazole] Other (See Comments)    Migraines    Gabapentin Hives and Other (See Comments)   Gluten Meal Diarrhea and Other (See Comments)    Stomach pain    Interferon Alfa-2a Hives   Iodine    Lactose     Other reaction(s):  Unknown   Lamotrigine Hives   Latex    Oseltamivir Other (See Comments)   Medications:  Current Outpatient Medications:    ALPRAZolam (XANAX) 0.5 MG tablet, Take 1 tablet (0.5 mg total) by mouth daily as needed for anxiety (for MRI)., Disp: 10 tablet, Rfl: 0   Ascorbic Acid (VITAMIN C) 500 MG/5ML LIQD, , Disp: , Rfl:    B Complex Vitamins (B COMPLEX 100 PO), Take 1 capsule by mouth daily., Disp: , Rfl:    baclofen (LIORESAL) 10 MG tablet, Take 10 mg by mouth as needed., Disp: , Rfl:    Cholecalciferol (VITAMIN D3 PO), Take 1 capsule by mouth daily., Disp: , Rfl:    cyclobenzaprine (FLEXERIL) 5 MG tablet, Take 1 tablet (5 mg total) by mouth 3 (three) times daily as needed for muscle spasms., Disp: 30 tablet, Rfl: 3   Digestive Enzyme CAPS, Take by mouth. Every meal, Disp: , Rfl:    escitalopram (LEXAPRO) 5 MG tablet, Take 1 tablet (5 mg total) by mouth daily., Disp: 90 tablet, Rfl: 3   ibuprofen (ADVIL) 200 MG tablet, Take by mouth., Disp: , Rfl:    JUBLIA 10 % SOLN, , Disp: , Rfl:    Magnesium 500 MG TABS, Take by mouth., Disp: , Rfl:    ondansetron (ZOFRAN) 4 MG tablet, Take 1 tablet (4 mg total) by mouth every 8 (eight) hours as needed for nausea or vomiting., Disp: 20 tablet, Rfl: 1   Probiotic Product (PROBIOTIC MATURE ADULT) CAPS, Take by mouth., Disp: , Rfl:    topiramate (TOPAMAX) 25 MG tablet, Take 1 tablet (25 mg total) by mouth 2 (two) times daily., Disp: 180 tablet, Rfl: 3   Turmeric 500 MG CAPS, Take by mouth., Disp: , Rfl:    Zinc 22.5 MG TABS, , Disp: , Rfl:   Observations/Objective: Patient is well-developed, well-nourished in no acute distress.  Resting comfortably  at home.  Head is normocephalic, atraumatic.  No labored breathing.  Speech is clear and coherent with logical content.  Patient is alert and oriented at baseline.    Assessment and Plan: 1. History of multiple sclerosis (HCC) (Primary)  2. Strain of right shoulder, initial encounter  Patient shares  with me that she has right shoulder pain after sitting awkwardly and watching TV. She states she is currently not treated for her MS as she has multiple reactions to medications. Out of concern I differed treating h with steroids as I am not this patient's PCP and she has no acutely emergent condition that would warrant steroid treatment.I instead advised anti inflammatories and tylenol in the interim however stressed that if her symptoms worsen she should report to the ER for evaluation. While she was upset, she agreed to this plan. All questions answered.   Follow Up Instructions: I discussed the assessment and treatment plan with the patient. The patient was provided an opportunity to ask questions and all were answered. The patient agreed with the plan and demonstrated an understanding of the instructions.  A copy of instructions were sent to the patient via MyChart unless  otherwise noted below.   The patient was advised to call back or seek an in-person evaluation if the symptoms worsen or if the condition fails to improve as anticipated.    Laura Kidney, PA-C

## 2023-10-13 ENCOUNTER — Ambulatory Visit (INDEPENDENT_AMBULATORY_CARE_PROVIDER_SITE_OTHER): Payer: Medicare Other | Admitting: Family Medicine

## 2023-10-13 ENCOUNTER — Encounter: Payer: Self-pay | Admitting: Family Medicine

## 2023-10-13 VITALS — BP 130/83 | HR 67 | Temp 97.9°F | Resp 18 | Ht 66.0 in | Wt 222.4 lb

## 2023-10-13 DIAGNOSIS — M25511 Pain in right shoulder: Secondary | ICD-10-CM

## 2023-10-13 MED ORDER — METHYLPREDNISOLONE 4 MG PO TBPK: ORAL_TABLET | ORAL | 0 refills | Status: DC

## 2023-10-13 NOTE — Patient Instructions (Signed)
 Sorry things are flared.  Glad you have improved.

## 2023-10-13 NOTE — Progress Notes (Signed)
 Subjective:     Patient ID: Laura Maldonado, female    DOB: 08/02/1966, 58 y.o.   MRN: [id number]  Chief Complaint  Patient presents with   Arm Pain    Pain, weakness and discomfort in right deltoid that started Friday, woke up with it, having difficulty using arm, possible MS Flare or radiculopathy    HPI Discussed the use of AI scribe software for clinical note transcription with the patient, who gave verbal consent to proceed.  History of Present Illness   Laura Maldonado is a 58 year old female with multiple sclerosis who presents with right shoulder pain. She is accompanied by her husband, Virl Diamond.  She experienced a sudden onset of stabbing pain in her right shoulder deltoid area upon waking on October 09, 2023. The pain progressively worsened throughout the day, leading to significant weakness due to pain, and by evening, she was unable to use her arm effectively. She describes the pain as severe, rating it an eight out of ten. On February 22, she took two tablets of methylprednisolone and started baclofen, noting improvement in her symptoms by the time of her virtual urgent care appointment at 5:30 PM.  During the virtual urgent care visit, she felt the provider was dismissive of her situation, noting that her pain was recorded as mild despite her reporting it as severe. She is concerned about the lack of recognition of her MS in her medical records during urgent care visits.  She has a history of multiple sclerosis for over twenty years and has experienced similar issues in the past, including a C4-C5 bulging disc diagnosed by sports medicine. She has had an MRI and previous treatments with methylprednisolone have been effective. She has not been on treatment for MS due to ongoing discussions with neurology about treatment reactions.  She identifies potential triggers for her current flare-up, including exposure to cold weather, a prolonged dental visit in early February,  and an awkward sitting position the night before her symptoms began. She notes that her temperature regulation is impaired, and she often does not sweat or get hot when others do.  She has been using baclofen and has a half tablet of methylprednisolone remaining. She is cautious about taking additional medications due to potential interactions and is concerned about the impact of steroids on her immune system, especially with an upcoming dental procedure.       Health Maintenance Due  Topic Date Due   Medicare Annual Wellness (AWV)  Never done    Past Medical History:  Diagnosis Date   Allergy    Anxiety    Cancer (HCC)    melanoma-back   Chicken pox    Diverticulitis    Frequent headaches    GERD (gastroesophageal reflux disease)    Heart murmur    past hx detected by one MD   Migraines    Multiple sclerosis (HCC) 05/29/2016   [facility] q 6 months.   Neuromuscular disorder (HCC)    Obesity (BMI 30-39.9) 01/15/2017   Pure hypercholesterolemia 05/29/2016   Vitamin D deficiency 05/29/2016    Past Surgical History:  Procedure Laterality Date   ABDOMINAL HYSTERECTOMY  2008   fibroids,  BSO   BREAST EXCISIONAL BIOPSY Left 2007   BREAST SURGERY  2007   left lumpectomy   CESAREAN SECTION     CHOLECYSTECTOMY     COLONOSCOPY     FRACTURE SURGERY  1972/2001   left leg/rt ankle   MELANOMA EXCISION  1986  MOLE REMOVAL     UPPER GASTROINTESTINAL ENDOSCOPY       Current Outpatient Medications:    ALPRAZolam (XANAX) 0.5 MG tablet, Take 1 tablet (0.5 mg total) by mouth daily as needed for anxiety (for MRI)., Disp: 10 tablet, Rfl: 0   ascorbic acid (VITAMIN C) 500 MG tablet, Take by mouth., Disp: , Rfl:    B Complex Vitamins (B COMPLEX 100 PO), Take 1 capsule by mouth daily., Disp: , Rfl:    baclofen (LIORESAL) 10 MG tablet, Take 1 tablet (10 mg total) by mouth as needed for up to 14 days., Disp: 30 each, Rfl: 0   Cholecalciferol (VITAMIN D3 PO), Take 1 capsule by mouth daily.,  Disp: , Rfl:    Digestive Enzyme CAPS, Take by mouth. Every meal, Disp: , Rfl:    ibuprofen (ADVIL) 200 MG tablet, Take by mouth., Disp: , Rfl:    JUBLIA 10 % SOLN, , Disp: , Rfl:    Magnesium 500 MG TABS, Take by mouth., Disp: , Rfl:    methylPREDNISolone (MEDROL DOSEPAK) 4 MG TBPK tablet, Please take per packaging instructions., Disp: 21 tablet, Rfl: 0   Probiotic Product (PROBIOTIC MATURE ADULT) CAPS, Take by mouth., Disp: , Rfl:    topiramate (TOPAMAX) 25 MG tablet, Take 1 tablet (25 mg total) by mouth 2 (two) times daily., Disp: 180 tablet, Rfl: 3   Turmeric 500 MG CAPS, Take by mouth., Disp: , Rfl:    Zinc Acetate 25 MG CAPS, Take by mouth., Disp: , Rfl:   Allergies  Allergen Reactions   Bee Venom Anaphylaxis, Itching and Swelling   Shellfish Allergy Shortness Of Breath and Other (See Comments)   Codeine Nausea Only and Other (See Comments)    Other Reaction(s): GI Intolerance   Gabapentin Hives and Other (See Comments)   Interferons Other (See Comments)    Chest pains, burning trouble breathing itching   Acetaminophen Other (See Comments)    Elevates live enzymes   Flagyl [Metronidazole] Other (See Comments)    Migraines    Gluten Meal Diarrhea and Other (See Comments)    Stomach pain    Interferon Alfa-2a Hives   Iodine    Lactose     Other reaction(s): Unknown   Lamotrigine Hives   Latex    Oseltamivir Other (See Comments)   ROS neg/noncontributory except as noted HPI/below      Objective:     BP 130/83   Pulse 67   Temp 97.9 F (36.6 C) (Temporal)   Resp 18   Ht 5\' 6"  (1.676 m)   Wt 222 lb 6 oz (100.9 kg)   LMP  (LMP Unknown)   SpO2 100%   BMI 35.89 kg/m  Wt Readings from Last 3 Encounters:  10/13/23 222 lb 6 oz (100.9 kg)  05/20/23 224 lb (101.6 kg)  02/04/23 221 lb 6 oz (100.4 kg)    Physical Exam   Gen: WDWN NAD HEENT: NCAT, conjunctiva not injected, sclera nonicteric NECK:  supple,  MSK: no gross abnormalities. Some TTP R bicep groove and  deltoid area.  Good strength and ROM NEURO: A&O x3.  CN II-XII intact.  PSYCH: normal mood. Good eye contact  Reviewed UC note    Assessment & Plan:  Acute pain of right shoulder  Other orders -     methylPREDNISolone; Please take per packaging instructions.  Dispense: 21 tablet; Refill: 0  Assessment and Plan    Right Shoulder Pain   Acute stabbing pain in the right  deltoid insertion since October 09, 2023, causes significant discomfort and weakness. Self-administered methylprednisolone and baclofen provided some relief. Differential includes muscle strain, MS flare, or cervical radiculopathy. Discussed steroid risks, including immune suppression, and emphasized completing the dose pack to prevent recurrence. Prefers to avoid physical therapy clinics during flu season due to infection risk. Prescribe methylprednisolone dose pack. Continue home physical therapy exercises. Recommend avoiding physical therapy clinics during flu season. Discuss potential triggers like cold weather and prolonged dental procedures. Use acetaminophen for pain management, avoid NSAIDs while on steroids.  Multiple Sclerosis (MS)   Long-standing MS for over 20 years, currently untreated due to past adverse reactions. Recent possible flare possibly triggered by cold weather and prolonged dental procedures. Discussed managing MS triggers and maintaining health. Expressed frustration with previous urgent care visit and lack of understanding of her condition. Discuss potential MS treatments with neurologist. Provide information on accessing acute care services like [facility] for emergent issues.  More than likely, this pain was not MS flaring, but steroids would help as well  General Health Maintenance   Concerned about flu season, prefers avoiding crowded places like physical therapy clinics. Discussed importance of managing MS triggers and maintaining health. Advise on flu prevention measures. Encourage regular  follow-ups with primary care and neurology.  Follow-up   Communicate with dental office about needs during procedures. Schedule follow-up appointment to reassess shoulder pain and MS symptoms.        Return if symptoms worsen or fail to improve.  Angelena Sole, MD

## 2023-10-16 ENCOUNTER — Ambulatory Visit: Payer: Medicare Other | Admitting: Family Medicine

## 2023-11-05 ENCOUNTER — Other Ambulatory Visit (HOSPITAL_BASED_OUTPATIENT_CLINIC_OR_DEPARTMENT_OTHER): Payer: Self-pay | Admitting: Family Medicine

## 2023-11-05 DIAGNOSIS — Z1231 Encounter for screening mammogram for malignant neoplasm of breast: Secondary | ICD-10-CM

## 2023-12-02 ENCOUNTER — Ambulatory Visit: Admitting: Family Medicine

## 2023-12-07 ENCOUNTER — Ambulatory Visit (INDEPENDENT_AMBULATORY_CARE_PROVIDER_SITE_OTHER): Admitting: Family Medicine

## 2023-12-07 ENCOUNTER — Encounter: Payer: Self-pay | Admitting: Family Medicine

## 2023-12-07 VITALS — BP 130/84 | HR 88 | Temp 97.6°F | Ht 66.0 in | Wt 221.0 lb

## 2023-12-07 DIAGNOSIS — J40 Bronchitis, not specified as acute or chronic: Secondary | ICD-10-CM

## 2023-12-07 DIAGNOSIS — Z8249 Family history of ischemic heart disease and other diseases of the circulatory system: Secondary | ICD-10-CM | POA: Diagnosis not present

## 2023-12-07 MED ORDER — AZITHROMYCIN 250 MG PO TABS: ORAL_TABLET | ORAL | 0 refills | Status: AC

## 2023-12-07 NOTE — Progress Notes (Signed)
 Subjective:     Patient ID: Laura Maldonado, female    DOB: 02/21/1966, 58 y.o.   MRN: [id number]  Chief Complaint  Patient presents with   Cough    Pt c/o lingering cough since 4/8, dry non-productive cough, went to UC on Monday 4/14, having burning in chest with coughing, but less since finished Prednisone . Having some Wheezing, COVID Test Neg x 2, Flu Neg. Fever 101.1 on 4/14.    HPI Discussed the use of AI scribe software for clinical note transcription with the patient, who gave verbal consent to proceed.  History of Present Illness Laura Maldonado is a 58 year old female with multiple sclerosis who presents with a persistent cough and chest discomfort.  Symptoms began around November 24, 2023, with allergy-like symptoms(for a couple of weeks) initially managed by Zyrtec. After exposure to an individual with a severe cough, symptoms worsened, leading to a 'choking, strangling cough' and urinary incontinence during coughing episodes.  She sought care at [facility] on November 30, 2023, and was prescribed albuterol , prednisone  (20 mg twice daily for five days), and Tessalon Perles. Zyrtec seemed to exacerbate her cough, so she switched to Claritin D, which provided some improvement.  She experienced a fever of 101.26F on November 30, 2023, but COVID-19 and flu tests were negative. She also reported chills and sweating with a low temperature of 97.26F a few days later. Currently, she no longer has a fever but continues to experience chest burning, particularly on the left side, and wheezing at night.  The chest burning is persistent and initially located on the left side, with some improvement. Shortness of breath has improved significantly since the onset of symptoms. Albuterol  has been helpful in managing symptoms.  Her husband, who was also exposed to the coughing individual, received amoxicillin  and is recovering. She questions whether her symptoms are due to allergies or an  infection acquired from the exposure.  She has a history of multiple sclerosis and is sensitive to medications, noting that she is 'ultra sensitive' to them. She is not currently taking cholesterol medication despite having high cholesterol and a family history of heart disease. Would like a calcium score    Health Maintenance Due  Topic Date Due   Medicare Annual Wellness (AWV)  Never done    Past Medical History:  Diagnosis Date   Allergy    Anxiety    Cancer (HCC)    melanoma-back   Chicken pox    Diverticulitis    Frequent headaches    GERD (gastroesophageal reflux disease)    Heart murmur    past hx detected by one MD   Migraines    Multiple sclerosis (HCC) 05/29/2016   [facility] q 6 months.   Neuromuscular disorder (HCC)    Obesity (BMI 30-39.9) 01/15/2017   Pure hypercholesterolemia 05/29/2016   Vitamin D  deficiency 05/29/2016    Past Surgical History:  Procedure Laterality Date   ABDOMINAL HYSTERECTOMY  2008   fibroids,  BSO   BREAST EXCISIONAL BIOPSY Left 2007   BREAST SURGERY  2007   left lumpectomy   CESAREAN SECTION     CHOLECYSTECTOMY     COLONOSCOPY     FRACTURE SURGERY  1972/2001   left leg/rt ankle   MELANOMA EXCISION  1986   MOLE REMOVAL     UPPER GASTROINTESTINAL ENDOSCOPY       Current Outpatient Medications:    albuterol  (VENTOLIN  HFA) 108 (90 Base) MCG/ACT inhaler, Inhale 2 puffs  into the lungs every 4 (four) hours as needed., Disp: , Rfl:    ALPRAZolam  (XANAX ) 0.5 MG tablet, Take 1 tablet (0.5 mg total) by mouth daily as needed for anxiety (for MRI)., Disp: 10 tablet, Rfl: 0   ascorbic acid (VITAMIN C) 500 MG tablet, Take by mouth., Disp: , Rfl:    azithromycin  (ZITHROMAX ) 250 MG tablet, Take 2 tablets on day 1, then 1 tablet daily on days 2 through 5, Disp: 6 tablet, Rfl: 0   B Complex Vitamins (B COMPLEX 100 PO), Take 1 capsule by mouth daily., Disp: , Rfl:    benzonatate (TESSALON) 200 MG capsule, Take 200 mg by mouth 3 (three) times  daily as needed., Disp: , Rfl:    Cholecalciferol (VITAMIN D3 PO), Take 1 capsule by mouth daily., Disp: , Rfl:    Digestive Enzyme CAPS, Take by mouth. Every meal, Disp: , Rfl:    ibuprofen (ADVIL) 200 MG tablet, Take by mouth., Disp: , Rfl:    JUBLIA 10 % SOLN, , Disp: , Rfl:    loratadine-pseudoephedrine (CLARITIN-D 24-HOUR) 10-240 MG 24 hr tablet, Take 1 tablet by mouth daily., Disp: , Rfl:    Magnesium 500 MG TABS, Take by mouth., Disp: , Rfl:    Probiotic Product (PROBIOTIC MATURE ADULT) CAPS, Take by mouth., Disp: , Rfl:    topiramate  (TOPAMAX ) 25 MG tablet, Take 1 tablet (25 mg total) by mouth 2 (two) times daily., Disp: 180 tablet, Rfl: 3   Turmeric 500 MG CAPS, Take by mouth., Disp: , Rfl:    Zinc Acetate 25 MG CAPS, Take by mouth., Disp: , Rfl:   Allergies  Allergen Reactions   Bee Venom Anaphylaxis, Itching and Swelling   Shellfish Allergy Shortness Of Breath and Other (See Comments)   Codeine Nausea Only and Other (See Comments)    Other Reaction(s): GI Intolerance   Gabapentin Hives and Other (See Comments)   Interferons Other (See Comments)    Chest pains, burning trouble breathing itching   Acetaminophen  Other (See Comments)    Elevates live enzymes   Flagyl [Metronidazole] Other (See Comments)    Migraines    Gluten Meal Diarrhea and Other (See Comments)    Stomach pain    Interferon Alfa-2a Hives   Iodine    Lactose     Other reaction(s): Unknown   Lamotrigine Hives   Latex    Oseltamivir Other (See Comments)   ROS neg/noncontributory except as noted HPI/below      Objective:     BP 130/84 (BP Location: Left Arm, Patient Position: Sitting, Cuff Size: Large)   Pulse 88   Temp 97.6 F (36.4 C) (Temporal)   Ht 5\' 6"  (1.676 m)   Wt 221 lb (100.2 kg)   LMP  (LMP Unknown)   SpO2 98%   BMI 35.67 kg/m  Wt Readings from Last 3 Encounters:  12/07/23 221 lb (100.2 kg)  10/13/23 222 lb 6 oz (100.9 kg)  05/20/23 224 lb (101.6 kg)    Physical Exam    Gen: WDWN NAD HEENT: NCAT, conjunctiva not injected, sclera nonicteric TM WNL B, OP moist, no exudates  NECK:  supple, no thyromegaly, no nodes, no carotid bruits CARDIAC: RRR, S1S2+, no murmur.  LUNGS: CTAB. No wheezes ABDOMEN:  BS+, soft, sl tender LLQ-no defect felt, No HSM, no masses EXT:  no edema MSK: no gross abnormalities.  NEURO: A&O x3.  CN II-XII intact.  PSYCH: normal mood. Good eye contact     Assessment &  Plan:  Bronchitis  Family history of early CAD -     CT CARDIAC SCORING (SELF PAY ONLY); Future  Other orders -     Azithromycin ; Take 2 tablets on day 1, then 1 tablet daily on days 2 through 5  Dispense: 6 tablet; Refill: 0  Assessment and Plan Assessment & Plan Cough and wheezing   She has a persistent cough and wheezing since exposure to someone with a severe cough. Initially treated with albuterol , prednisone , and Tessalon Perles, her cough improved with Claritin D but persists. There are concerns about potential pneumonia or bronchitis due to a persistent burning sensation in the left chest area. COVID-19 and influenza tests are negative. Albuterol  relieves wheezing, but Claritin D causes jitteriness. Continue albuterol  as needed. Switch from Claritin D to plain Claritin to reduce jitteriness. Prescribe Z-Pak to address potential pneumonia in the left chest area.   Allergic rhinitis   Allergy symptoms were initially managed with Zyrtec, which exacerbated her cough. Switching to Claritin D provided some improvement, but symptoms may be worsened by environmental allergens. Claritin D causes jitteriness. Switch from Claritin D to plain Claritin to reduce jitteriness.  Cardiac risk assessment   A discussion was held about obtaining a calcium score for cardiac risk assessment due to her family history of heart disease and previous reports of a murmur. She has hyperlipidemia and a less active lifestyle, making her a candidate for preventive measures. A calcium score  can guide decisions on starting cholesterol medication but will not address murmur concerns. Order a calcium score at the [facility] location. She will schedule it when feeling better.    Return if symptoms worsen or fail to improve.  Ellsworth Haas, MD

## 2023-12-07 NOTE — Patient Instructions (Signed)
 Calcium score ordered  Zpack sent to the pharmacy

## 2023-12-08 ENCOUNTER — Ambulatory Visit: Admitting: Family Medicine

## 2023-12-09 ENCOUNTER — Ambulatory Visit: Admitting: Family Medicine

## 2023-12-21 ENCOUNTER — Ambulatory Visit (HOSPITAL_BASED_OUTPATIENT_CLINIC_OR_DEPARTMENT_OTHER)
Admission: RE | Admit: 2023-12-21 | Discharge: 2023-12-21 | Disposition: A | Discharge status: Home / Self Care | Source: Ambulatory Visit | Attending: Family Medicine | Admitting: Family Medicine

## 2023-12-21 ENCOUNTER — Encounter (HOSPITAL_BASED_OUTPATIENT_CLINIC_OR_DEPARTMENT_OTHER): Payer: Self-pay | Admitting: Radiology

## 2023-12-21 DIAGNOSIS — Z1231 Encounter for screening mammogram for malignant neoplasm of breast: Secondary | ICD-10-CM | POA: Diagnosis present

## 2023-12-23 ENCOUNTER — Encounter: Payer: Self-pay | Admitting: Family Medicine

## 2024-05-20 ENCOUNTER — Ambulatory Visit (INDEPENDENT_AMBULATORY_CARE_PROVIDER_SITE_OTHER): Payer: Medicare Other | Admitting: Family Medicine

## 2024-05-20 ENCOUNTER — Ambulatory Visit: Payer: Self-pay | Admitting: Family Medicine

## 2024-05-20 ENCOUNTER — Encounter: Payer: Self-pay | Admitting: Family Medicine

## 2024-05-20 VITALS — BP 128/80 | HR 70 | Temp 98.1°F | Ht 66.0 in | Wt 217.0 lb

## 2024-05-20 DIAGNOSIS — F4323 Adjustment disorder with mixed anxiety and depressed mood: Secondary | ICD-10-CM

## 2024-05-20 DIAGNOSIS — Z79899 Other long term (current) drug therapy: Secondary | ICD-10-CM

## 2024-05-20 DIAGNOSIS — R739 Hyperglycemia, unspecified: Secondary | ICD-10-CM

## 2024-05-20 DIAGNOSIS — E559 Vitamin D deficiency, unspecified: Secondary | ICD-10-CM

## 2024-05-20 DIAGNOSIS — E2839 Other primary ovarian failure: Secondary | ICD-10-CM

## 2024-05-20 DIAGNOSIS — G35D Multiple sclerosis, unspecified: Secondary | ICD-10-CM

## 2024-05-20 DIAGNOSIS — R519 Headache, unspecified: Secondary | ICD-10-CM

## 2024-05-20 DIAGNOSIS — E7800 Pure hypercholesterolemia, unspecified: Secondary | ICD-10-CM | POA: Diagnosis not present

## 2024-05-20 DIAGNOSIS — C439 Malignant melanoma of skin, unspecified: Secondary | ICD-10-CM

## 2024-05-20 DIAGNOSIS — G8929 Other chronic pain: Secondary | ICD-10-CM

## 2024-05-20 LAB — CBC WITH DIFFERENTIAL/PLATELET
Basophils Absolute: 0.1 K/uL (ref 0.0–0.1)
Basophils Relative: 1.3 % (ref 0.0–3.0)
Eosinophils Absolute: 0.1 K/uL (ref 0.0–0.7)
Eosinophils Relative: 1 % (ref 0.0–5.0)
HCT: 40.1 % (ref 36.0–46.0)
Hemoglobin: 13.5 g/dL (ref 12.0–15.0)
Lymphocytes Relative: 27.9 % (ref 12.0–46.0)
Lymphs Abs: 1.6 K/uL (ref 0.7–4.0)
MCHC: 33.7 g/dL (ref 30.0–36.0)
MCV: 92.7 fl (ref 78.0–100.0)
Monocytes Absolute: 0.4 K/uL (ref 0.1–1.0)
Monocytes Relative: 6.8 % (ref 3.0–12.0)
Neutro Abs: 3.6 K/uL (ref 1.4–7.7)
Neutrophils Relative %: 63 % (ref 43.0–77.0)
Platelets: 276 K/uL (ref 150.0–400.0)
RBC: 4.33 Mil/uL (ref 3.87–5.11)
RDW: 13.3 % (ref 11.5–15.5)
WBC: 5.7 K/uL (ref 4.0–10.5)

## 2024-05-20 LAB — COMPREHENSIVE METABOLIC PANEL WITH GFR
ALT: 20 U/L (ref 0–35)
AST: 22 U/L (ref 0–37)
Albumin: 4.4 g/dL (ref 3.5–5.2)
Alkaline Phosphatase: 85 U/L (ref 39–117)
BUN: 15 mg/dL (ref 6–23)
CO2: 26 meq/L (ref 19–32)
Calcium: 9.7 mg/dL (ref 8.4–10.5)
Chloride: 104 meq/L (ref 96–112)
Creatinine, Ser: 0.65 mg/dL (ref 0.40–1.20)
GFR: 96.55 mL/min (ref >60.00)
Glucose, Bld: 83 mg/dL (ref 70–99)
Potassium: 4.1 meq/L (ref 3.5–5.1)
Sodium: 139 meq/L (ref 135–145)
Total Bilirubin: 0.5 mg/dL (ref 0.2–1.2)
Total Protein: 7.3 g/dL (ref 6.0–8.3)

## 2024-05-20 LAB — VITAMIN D 25 HYDROXY (VIT D DEFICIENCY, FRACTURES): VITD: 31.14 ng/mL (ref 30.00–100.00)

## 2024-05-20 LAB — LIPID PANEL
Cholesterol: 213 mg/dL — ABNORMAL HIGH (ref 0–200)
HDL: 64.2 mg/dL (ref >39.00)
LDL Cholesterol: 130 mg/dL — ABNORMAL HIGH (ref 0–99)
NonHDL: 148.99
Total CHOL/HDL Ratio: 3
Triglycerides: 97 mg/dL (ref 0.0–149.0)
VLDL: 19.4 mg/dL (ref 0.0–40.0)

## 2024-05-20 LAB — TSH: TSH: 2.06 u[IU]/mL (ref 0.35–5.50)

## 2024-05-20 LAB — HEMOGLOBIN A1C: Hgb A1c MFr Bld: 5.6 % (ref 4.6–6.5)

## 2024-05-20 MED ORDER — TOPIRAMATE 25 MG PO TABS: 25.0000 mg | ORAL_TABLET | Freq: Two times a day (BID) | ORAL | 3 refills | Status: AC

## 2024-05-20 MED ORDER — LORAZEPAM 0.5 MG PO TABS: 0.5000 mg | ORAL_TABLET | Freq: Two times a day (BID) | ORAL | 1 refills | Status: AC | PRN

## 2024-05-20 NOTE — Progress Notes (Signed)
 Labs look great except cholesterol could be a little better.

## 2024-05-20 NOTE — Progress Notes (Signed)
 Subjective:     Patient ID: Laura Maldonado, female    DOB: 02-19-1966, 58 y.o.   MRN: [id number]  Chief Complaint  Patient presents with   Annual Exam    Physical;    HPI Annual f/u-some limited exercise-heat intol Discussed the use of AI scribe software for clinical note transcription with the patient, who gave verbal consent to proceed.  History of Present Illness Laura Maldonado is a 58 year old female with multiple sclerosis, headaches, hyperglycemia, and adjustment disorder who presents follow-up.  She is managing her multiple sclerosis despite stress from her husband's health issues. She experiences heat intolerance, which affects her ability to exercise, and has increased itching during certain seasons, possibly due to allergies. She uses a walking pad at home to maintain some level of physical activity. Seeing neuro.  Stable  had dxa in past d/t MS and wonders if needs repeat  She discontinued Lexapro  several months ago due to weight gain and is not currently on any medication for depression or anxiety. She has a history of taking Wellbutrin  about twenty years ago but does not recall its effects. She wants support and counseling but has faced difficulties finding providers who accept Medicare. Coping ok.  Stressed.  No SI  She takes Topamax  20 mg twice a day for her headaches. Controls.  She also uses Xanax  for MRIs and dental procedures due to anxiety.   Some days, more anxiety and would like Ativan  for bad days  She has an overactive bladder, which she manages by limiting fluid intake before going out. She also mentions spider veins on her legs, more pronounced on the left side, and she is familiar with using compression stockings.  Her daughter lives in [place], and she occasionally stays with her to get a break. She is considering moving closer to her daughter but finds it challenging due to high living costs in the area.  She has a history of melanoma and regularly  sees a dermatologist. She had one significant melanoma removed in 1980 and several other suspicious spots removed over the years. She is cautious about her skin health and continues regular dermatological check-ups.  Hld-runs on borderline.  Working some on diet.  Limited exercise Vit D def-taking otc.    Health Maintenance Due  Topic Date Due   Medicare Annual Wellness (AWV)  Never done    Past Medical History:  Diagnosis Date   Allergy    Anxiety    Cancer (HCC)    melanoma-back   Chicken pox    Diverticulitis    Frequent headaches    GERD (gastroesophageal reflux disease)    Heart murmur    past hx detected by one MD   Migraines    Multiple sclerosis 05/29/2016   [facility] q 6 months.   Neuromuscular disorder (HCC)    Obesity (BMI 30-39.9) 01/15/2017   Pure hypercholesterolemia 05/29/2016   Vitamin D  deficiency 05/29/2016    Past Surgical History:  Procedure Laterality Date   ABDOMINAL HYSTERECTOMY  2008   fibroids,  BSO   BREAST EXCISIONAL BIOPSY Left 2007   BREAST SURGERY  2007   left lumpectomy   CESAREAN SECTION     CHOLECYSTECTOMY     COLONOSCOPY     FRACTURE SURGERY  1972/2001   left leg/rt ankle   MELANOMA EXCISION  1986   MOLE REMOVAL     UPPER GASTROINTESTINAL ENDOSCOPY       Current Outpatient Medications:    albuterol  (  VENTOLIN  HFA) 108 (90 Base) MCG/ACT inhaler, Inhale 2 puffs into the lungs every 4 (four) hours as needed., Disp: , Rfl:    ALPRAZolam  (XANAX ) 0.5 MG tablet, Take 1 tablet (0.5 mg total) by mouth daily as needed for anxiety (for MRI)., Disp: 10 tablet, Rfl: 0   ascorbic acid (VITAMIN C) 500 MG tablet, Take by mouth., Disp: , Rfl:    B Complex Vitamins (B COMPLEX 100 PO), Take 1 capsule by mouth daily., Disp: , Rfl:    Cholecalciferol (VITAMIN D3 PO), Take 1 capsule by mouth daily., Disp: , Rfl:    Digestive Enzyme CAPS, Take by mouth. Every meal, Disp: , Rfl:    ibuprofen (ADVIL) 200 MG tablet, Take by mouth., Disp: , Rfl:     JUBLIA 10 % SOLN, , Disp: , Rfl:    loratadine-pseudoephedrine (CLARITIN-D 24-HOUR) 10-240 MG 24 hr tablet, Take 1 tablet by mouth daily., Disp: , Rfl:    LORazepam  (ATIVAN ) 0.5 MG tablet, Take 1 tablet (0.5 mg total) by mouth 2 (two) times daily as needed for anxiety., Disp: 10 tablet, Rfl: 1   Magnesium 500 MG TABS, Take by mouth., Disp: , Rfl:    Probiotic Product (PROBIOTIC MATURE ADULT) CAPS, Take by mouth., Disp: , Rfl:    Turmeric 500 MG CAPS, Take by mouth., Disp: , Rfl:    Zinc Acetate 25 MG CAPS, Take by mouth., Disp: , Rfl:    topiramate  (TOPAMAX ) 25 MG tablet, Take 1 tablet (25 mg total) by mouth 2 (two) times daily., Disp: 180 tablet, Rfl: 3  Allergies  Allergen Reactions   Bee Venom Anaphylaxis, Itching and Swelling   Shellfish Allergy Shortness Of Breath and Other (See Comments)   Codeine Nausea Only and Other (See Comments)    Other Reaction(s): GI Intolerance   Gabapentin Hives and Other (See Comments)   Interferons Other (See Comments)    Chest pains, burning trouble breathing itching   Acetaminophen  Other (See Comments)    Elevates live enzymes   Flagyl [Metronidazole] Other (See Comments)    Migraines    Gluten Meal Diarrhea and Other (See Comments)    Stomach pain    Interferon Alfa-2a Hives   Iodine    Lactose     Other reaction(s): Unknown   Lamotrigine Hives   Latex    Oseltamivir Other (See Comments)   ROS neg/noncontributory except as noted HPI/below      Objective:     BP 128/80   Pulse 70   Temp 98.1 F (36.7 C)   Ht 5' 6 (1.676 m)   Wt 217 lb (98.4 kg)   LMP  (LMP Unknown)   SpO2 97%   BMI 35.02 kg/m  Wt Readings from Last 3 Encounters:  05/20/24 217 lb (98.4 kg)  12/07/23 221 lb (100.2 kg)  10/13/23 222 lb 6 oz (100.9 kg)    Physical Exam   Gen: WDWN NAD HEENT: NCAT, conjunctiva not injected, sclera nonicteric NECK:  supple, no thyromegaly, no nodes, no carotid bruits CARDIAC: RRR, S1S2+, no murmur. DP 2+B LUNGS: CTAB. No  wheezes ABDOMEN:  BS+, soft, NTND, No HSM, no masses EXT:  no edema. A lot of spider veins legs MSK: no gross abnormalities.  NEURO: A&O x3.  CN II-XII intact.  PSYCH: normal mood. Good eye contact     Assessment & Plan:  Multiple sclerosis -     DG Bone Density; Future  Chronic nonintractable headache, unspecified headache type -     Topiramate ; Take  1 tablet (25 mg total) by mouth 2 (two) times daily.  Dispense: 180 tablet; Refill: 3  Pure hypercholesterolemia -     Lipid panel -     TSH  Vitamin D  deficiency -     VITAMIN D  25 Hydroxy (Vit-D Deficiency, Fractures) -     DG Bone Density; Future  Melanoma of skin (HCC)  Adjustment disorder with mixed anxiety and depressed mood -     LORazepam ; Take 1 tablet (0.5 mg total) by mouth 2 (two) times daily as needed for anxiety.  Dispense: 10 tablet; Refill: 1  Hyperglycemia -     Comprehensive metabolic panel with GFR -     Hemoglobin A1c -     TSH  Estrogen deficiency -     DG Bone Density; Future  High risk medication use -     Comprehensive metabolic panel with GFR -     CBC with Differential/Platelet -     VITAMIN D  25 Hydroxy (Vit-D Deficiency, Fractures) -     DG Bone Density; Future  Assessment and Plan Assessment & Plan Multiple sclerosis   Management continues amidst stress from her husband's health issues, with no new exacerbations reported. Heat intolerance affects her exercise routine. Continue the current management plan and encourage regular, tolerable exercise to manage symptoms. Sees neuro.  Adjustment disorder with mixed anxiety and depressed mood   Her adjustment disorder is exacerbated by her husband's health issues. Lexapro  was discontinued due to weight gain, and there are challenges accessing counseling services through Medicare. Lorazepam  is considered for acute anxiety management, with a discussion on using Ativan  for situational anxiety, ensuring not to combine with Xanax  within 4-6 hours.  Prescribe lorazepam  10 tablets for situational anxiety and monitor its use to prevent dependency.pdmp checked  Headache disorder   Her headache disorder is managed with Topamax  25 mg twice daily, with no significant changes in headache frequency or severity. Continue Topamax  25 mg twice daily.  Pure hypercholesterolemia   She has elevated LDL cholesterol with protective high HDL. Total cholesterol is flagged as high but is not concerning due to high HDL. Continue monitoring cholesterol levels and encourage dietary modifications to manage cholesterol.  Hyperglycemia   The importance of maintaining a healthy diet and exercise to prevent diabetes was discussed. Continue monitoring blood glucose levels and encourage a healthy diet and regular exercise.  Chronic venous insufficiency with varicose veins, left lower extremity   Chronic venous insufficiency with varicose veins in the left lower extremity is managed with compression stockings. Recommend wearing compression stockings regularly, especially during the week.  Overactive bladder symptoms   Her overactive bladder symptoms are managed by limiting fluid intake before outings. Continue the current management strategy.  Malignant melanoma   She has significant malignant melanoma with regular dermatology follow-ups. Continue regular dermatology follow-ups.    Return in about 1 year (around 05/20/2025) for chronic follow-up.  Jenkins CHRISTELLA Carrel, MD

## 2024-05-20 NOTE — Patient Instructions (Signed)
It was very nice to see you today!  Compression stockings   PLEASE NOTE:  If you had any lab tests please let us know if you have not heard back within a few days. You may see your results on MyChart before we have a chance to review them but we will give you a call once they are reviewed by [place]. If we ordered any referrals today, please let us know if you have not heard from their office within the next week.   Please try these tips to maintain a healthy lifestyle:  Eat most of your calories during the day when you are active. Eliminate processed foods including packaged sweets (pies, cakes, cookies), reduce intake of potatoes, white bread, white pasta, and white rice. Look for whole grain options, oat flour or almond flour.  Each meal should contain half fruits/vegetables, one quarter protein, and one quarter carbs (no bigger than a computer mouse).  Cut down on sweet beverages. This includes juice, soda, and sweet tea. Also watch fruit intake, though this is a healthier sweet option, it still contains natural sugar! Limit to 3 servings daily.  Drink at least 1 glass of water with each meal and aim for at least 8 glasses per day  Exercise at least 150 minutes every week.

## 2024-05-23 ENCOUNTER — Ambulatory Visit: Payer: Self-pay

## 2024-05-23 ENCOUNTER — Ambulatory Visit: Admitting: Family Medicine

## 2024-05-23 VITALS — BP 128/80 | HR 75 | Temp 97.9°F | Ht 66.0 in | Wt 219.4 lb

## 2024-05-23 DIAGNOSIS — K115 Sialolithiasis: Secondary | ICD-10-CM | POA: Diagnosis not present

## 2024-05-23 NOTE — Telephone Encounter (Signed)
 Spoke with pt and she stated that she is scheduled to see Dr. Mercer at [place] today @3 :30

## 2024-05-23 NOTE — Telephone Encounter (Signed)
 FYI Only or Action Required?: FYI only for provider.  Patient was last seen in primary care on 05/20/2024 by Wendolyn Jenkins Jansky, MD.  Called Nurse Triage reporting Otalgia.  Symptoms began several days ago.  Interventions attempted: OTC medications: Claritin Maldonado.  Symptoms are: gradually worsening.  Triage Disposition: See Physician Within 24 Hours  Patient/caregiver understands and will follow disposition?: Yes    Copied from CRM [id number]. Topic: Clinical - Red Word Triage >> May 23, 2024  7:32 AM Laura Maldonado wrote: Red Word that prompted transfer to Nurse Triage: L Ear pain/ Swelling from ear to jaw Reason for Disposition  Earache  (Exceptions: Brief ear pain of lasting less than 60 minutes, or earache occurring during air travel.)  Answer Assessment - Initial Assessment Questions Patient initially thought it was an issue with her TMJ but has ruled that out, and states she is having drainage and other respiratory-like symptoms. Patient states she has noticeable swelling in left jaw/front of ear region that was relieved mildly by Claritin Maldonado. She noticed warmth in this area over the weekend. Denies neck stiffness and radiation of pain into neck, arm, and chest.  1. LOCATION: Which ear is involved?     Left ear, left jaw - swelling in left jaw area 2. ONSET: When did the ear pain start?      Friday  3. SEVERITY: How bad is the pain?  (Scale 1-10; mild, moderate or severe)     Right now, discomfort level is a 5-10 4. URI SYMPTOMS: Do you have a runny nose or cough?     Nasal drainage, post nasal drip 5. FEVER: Do you have a fever? If Yes, ask: What is your temperature, how was it measured, and when did it start?     I haven't taken it but I have had chills on and off and woke up sweating 6. CAUSE: Have you been swimming recently?, How often do you use Q-TIPS?, Have you had any recent air travel or scuba diving?     When I was a child that ear was injured with a q-tip  and in past several years it is giving me trouble 7. OTHER SYMPTOMS: Do you have any other symptoms? (e.g., decreased hearing, dizziness, headache, stiff neck, vomiting)     Drainage, sneezing, feels like everything is compacted in front of earlobe in jaw, very mild coughing  Protocols used: Earache-A-AH

## 2024-05-23 NOTE — Progress Notes (Signed)
 Established Patient Office Visit   Subjective  Patient ID: Laura Maldonado, female    DOB: 12/07/1965  Age: 58 y.o. MRN: [id number]  Chief Complaint  Patient presents with   Acute Visit    Patient came in today for left Ear and jaw pain with swelling, started 3 days ago, dizziness and white noise in the ear,   Pt accompanied by her husband.  Pt is a 58 yo female followed by Dr. Wendolyn and seen for acute concern.  Pt with intermittent L facial pain and edema x 4 days.  Started Friday afternoon.  Felt a screwing sensation with pain and tightness in L face.  Caused difficulty opening her mouth.  Symptoms improved then restarted yesterday afternoon.  Edema now resolved. Also had tinnitus.  Pt denies numbness/tingling, rash, dental pain, ear pain/pressure, fever, chills, ST.  Pt tried claritin, claritin-D, and Motrin for symptoms as well as laying in bed.  Incidentally symptoms may have improved with use of water pik.    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   Tinnitus 03/11/2022   JC virus antibody positive 03/22/2020   Melanoma of skin (HCC) 08/22/2019   Diverticulosis 08/22/2019   Chronic nonintractable headache 03/08/2017   Moderate episode of recurrent major depressive disorder (HCC) 03/08/2017   Vitamin D  deficiency 05/29/2016   Pure hypercholesterolemia 05/29/2016   Multiple sclerosis 05/29/2016   Past Medical History:  Diagnosis Date   Allergy    Anxiety    Cancer (HCC)    melanoma-back   Chicken pox    Diverticulitis    Frequent headaches    GERD (gastroesophageal reflux disease)    Heart murmur    past hx detected by one MD   Migraines    Multiple sclerosis 05/29/2016   [facility] q 6 months.   Neuromuscular disorder (HCC)    Obesity (BMI 30-39.9) 01/15/2017   Pure hypercholesterolemia 05/29/2016   Vitamin D  deficiency 05/29/2016   Past Surgical  History:  Procedure Laterality Date   ABDOMINAL HYSTERECTOMY  2008   fibroids,  BSO   BREAST EXCISIONAL BIOPSY Left 2007   BREAST SURGERY  2007   left lumpectomy   CESAREAN SECTION     CHOLECYSTECTOMY     COLONOSCOPY     FRACTURE SURGERY  1972/2001   left leg/rt ankle   MELANOMA EXCISION  1986   MOLE REMOVAL     UPPER GASTROINTESTINAL ENDOSCOPY     Social History   Tobacco Use   Smoking status: Never   Smokeless tobacco: Never  Vaping Use   Vaping status: Never Used  Substance Use Topics   Alcohol use: Not Currently    Comment: rare- maybe 5 x a year    Drug use: No   Family History  Problem Relation Age of Onset   Hypertension Mother    Cancer Mother    Stroke Mother    Heart disease Father    Early death Father    Hypertension Maternal Grandmother    Stroke Maternal Grandmother    Hypertension Maternal Grandfather    Hypertension Paternal Grandmother    Hypertension Paternal Grandfather    Heart disease Paternal Grandfather    Colon cancer Neg Hx    Colon polyps Neg Hx    Esophageal cancer Neg Hx    Rectal cancer Neg Hx    Stomach cancer Neg Hx    Allergies  Allergen Reactions   Bee Venom Anaphylaxis, Itching and Swelling   Shellfish Allergy Shortness Of Breath and Other (See Comments)   Codeine Nausea Only and Other (See Comments)    Other Reaction(s): GI Intolerance   Gabapentin Hives and Other (See Comments)   Interferons Other (See Comments)    Chest pains, burning trouble breathing itching   Acetaminophen  Other (See Comments)    Elevates live enzymes   Flagyl [Metronidazole] Other (See Comments)    Migraines    Gluten Meal Diarrhea and Other (See Comments)    Stomach pain    Interferon Alfa-2a Hives   Iodine    Lactose     Other reaction(s): Unknown   Lamotrigine Hives   Latex    Oseltamivir Other (See Comments)    ROS Negative unless stated above    Objective:     BP 128/80 (BP Location: Left Arm, Patient Position: Sitting, Cuff  Size: Large)   Pulse 75   Temp 97.9 F (36.6 C) (Oral)   Ht 5' 6 (1.676 m)   Wt 219 lb 6.4 oz (99.5 kg)   LMP  (LMP Unknown)   SpO2 100%   BMI 35.41 kg/m  BP Readings from Last 3 Encounters:  05/23/24 128/80  05/20/24 128/80  12/07/23 130/84   Wt Readings from Last 3 Encounters:  05/23/24 219 lb 6.4 oz (99.5 kg)  05/20/24 217 lb (98.4 kg)  12/07/23 221 lb (100.2 kg)      Physical Exam Constitutional:      General: She is not in acute distress.    Appearance: Normal appearance.  HENT:     Head: Normocephalic and atraumatic.     Right Ear: Tympanic membrane, ear canal and external ear normal.     Left Ear: Tympanic membrane, ear canal and external ear normal.     Nose: Nose normal.     Mouth/Throat:     Mouth: Mucous membranes are moist.     Comments: Enlargement of L parotid gland duct. TTP of L pre-auricular area. Cardiovascular:     Rate and Rhythm: Normal rate and regular rhythm.     Heart sounds: Normal heart sounds. No murmur heard.    No gallop.  Pulmonary:     Effort: Pulmonary effort is normal. No respiratory distress.     Breath sounds: Normal breath sounds. No wheezing, rhonchi or rales.  Skin:    General: Skin is warm and dry.  Neurological:     Mental Status: She is alert and oriented to person, place, and time.        05/20/2023    8:06 AM 02/04/2023   10:56 AM 03/11/2022    9:34 AM  Depression screen PHQ 2/9  Decreased Interest 0 1 0  Down, Depressed, Hopeless 0 1 0  PHQ - 2 Score 0 2 0  Altered sleeping 0 0 1  Tired, decreased energy 2 2 0  Change in appetite 0 2 0  Feeling bad or failure about yourself  0 1 0  Trouble concentrating 0 1 0  Moving slowly or fidgety/restless 0 0 0  Suicidal thoughts 0 0 0  PHQ-9 Score 2 8 1   Difficult doing work/chores Not difficult at all Somewhat difficult Not difficult at all      05/20/2023    8:07 AM 02/04/2023   10:57 AM 07/27/2020    8:57 AM 08/22/2019    1:04 PM  GAD 7 : Generalized Anxiety Score   Nervous, Anxious, on Edge 0 1  1 0  Control/stop worrying 0 2 1 0  Worry too much - different things 0 2 0 0  Trouble relaxing 0 2 0 1  Restless 0 1 0 0  Easily annoyed or irritable 1 3 1  0  Afraid - awful might happen 0 2 0 0  Total GAD 7 Score 1 13 3 1   Anxiety Difficulty Not difficult at all Somewhat difficult Not difficult at all Not difficult at all     No results found for any visits on 05/23/24.    Assessment & Plan:   Sialodocholithiasis  Acute intermittent L face edema and pain now resolved.  Likely 2/2 parotid gland duct stone.  Given info.  In case symptoms return, supportive care.  Return if symptoms worsen or fail to improve.   Clotilda JONELLE Single, MD

## 2024-06-30 ENCOUNTER — Ambulatory Visit (INDEPENDENT_AMBULATORY_CARE_PROVIDER_SITE_OTHER): Admitting: Family Medicine

## 2024-06-30 ENCOUNTER — Encounter: Payer: Self-pay | Admitting: Family Medicine

## 2024-06-30 VITALS — BP 162/98 | HR 77 | Temp 97.3°F | Ht 66.0 in | Wt 216.6 lb

## 2024-06-30 DIAGNOSIS — R6884 Jaw pain: Secondary | ICD-10-CM | POA: Diagnosis not present

## 2024-06-30 DIAGNOSIS — H9202 Otalgia, left ear: Secondary | ICD-10-CM | POA: Diagnosis not present

## 2024-06-30 DIAGNOSIS — J4520 Mild intermittent asthma, uncomplicated: Secondary | ICD-10-CM | POA: Diagnosis not present

## 2024-06-30 MED ORDER — METHYLPREDNISOLONE 4 MG PO TBPK: ORAL_TABLET | ORAL | 1 refills | Status: AC

## 2024-06-30 MED ORDER — ALBUTEROL SULFATE HFA 108 (90 BASE) MCG/ACT IN AERS: 2.0000 | INHALATION_SPRAY | RESPIRATORY_TRACT | 1 refills | Status: AC | PRN

## 2024-06-30 NOTE — Patient Instructions (Signed)
 Voltaren  gel(diclofenac  gel)-use 3-4x/day when going to dentist

## 2024-06-30 NOTE — Progress Notes (Addendum)
 Subjective:     Patient ID: Laura Maldonado, female    DOB: 07/01/1966, 58 y.o.   MRN: [id number]  Chief Complaint  Patient presents with   Nerve Pain    Nerve pain on left side of ear and jaw. Having post nasal drip. No fever. Did have dental work a week ago. Also was diagnosed wit a salivary stone.    Discussed the use of AI scribe software for clinical note transcription with the patient, who gave verbal consent to proceed.  History of Present Illness Laura Maldonado is a 58 year old female with possible trigeminal neuralgia who presents with left-sided jaw pain and ear fullness.  She experienced sudden left-sided jaw pain over a weekend, described as a 'crushing screw down' sensation, with radiation to her left neck and ear. She initially feared it was a heart attack. She has a history of similar episodes correlating with seasonal allergies and dental visits, particularly after prolonged dental procedures with her mouth open. Saw provider.  Dx siladinitis  A previous episode in October 2021 coincided with seasonal allergies and a dental appointment. She has undergone multiple dental procedures on the upper left side over the past few years, which she believes may have triggered or aggravated her symptoms. She has a history of possible trigeminal neuralgia diagnosed by a neurologist years ago, which could be related to her current symptoms.  She has been using Claritin D with some relief in the past, although she is unsure of its effectiveness. She has also been prescribed a Medrol  dose pack previously, which helped alleviate her symptoms.  She experiences a sensation of fullness and ringing in her left ear, described as 'phantom' and 'white noise.' This sensation is sometimes accompanied by radiating nerve pain that can be debilitating, causing her to stay in bed. She has a history of hearing loss on the left side, confirmed by a previous workup, and was told she might eventually  need hearing aids.  She is diligent about maintaining oral hygiene and is concerned about potential bacterial infections from dental procedures, although she has not had any confirmed infections. She has a history of a tonsil stone but no routine issues with dry mouth or salivary stones. She uses an albuterol  inhaler.    Health Maintenance Due  Topic Date Due   Medicare Annual Wellness (AWV)  Never done    Past Medical History:  Diagnosis Date   Allergy    Anxiety    Cancer (HCC)    melanoma-back   Chicken pox    Diverticulitis    Frequent headaches    GERD (gastroesophageal reflux disease)    Heart murmur    past hx detected by one MD   Migraines    Multiple sclerosis 05/29/2016   [facility] q 6 months.   Neuromuscular disorder (HCC)    Obesity (BMI 30-39.9) 01/15/2017   Pure hypercholesterolemia 05/29/2016   Vitamin D  deficiency 05/29/2016    Past Surgical History:  Procedure Laterality Date   ABDOMINAL HYSTERECTOMY  2008   fibroids,  BSO   BREAST EXCISIONAL BIOPSY Left 2007   BREAST SURGERY  2007   left lumpectomy   CESAREAN SECTION     CHOLECYSTECTOMY     COLONOSCOPY     FRACTURE SURGERY  1972/2001   left leg/rt ankle   MELANOMA EXCISION  1986   MOLE REMOVAL     UPPER GASTROINTESTINAL ENDOSCOPY       Current Outpatient Medications:    ALPRAZolam  (  XANAX ) 0.5 MG tablet, Take 1 tablet (0.5 mg total) by mouth daily as needed for anxiety (for MRI)., Disp: 10 tablet, Rfl: 0   ascorbic acid (VITAMIN C) 500 MG tablet, Take by mouth., Disp: , Rfl:    B Complex Vitamins (B COMPLEX 100 PO), Take 1 capsule by mouth daily., Disp: , Rfl:    baclofen  (LIORESAL ) 10 MG tablet, Take 10 mg by mouth 3 (three) times daily., Disp: , Rfl:    calcium carbonate (OSCAL) 1500 (600 Ca) MG TABS tablet, Take 600 mg by mouth., Disp: , Rfl:    Cholecalciferol (VITAMIN D3 PO), Take 1 capsule by mouth daily., Disp: , Rfl:    Digestive Enzyme CAPS, Take by mouth. Every meal, Disp: , Rfl:     ibuprofen (ADVIL) 200 MG tablet, Take by mouth., Disp: , Rfl:    JUBLIA 10 % SOLN, , Disp: , Rfl:    loratadine-pseudoephedrine (CLARITIN-D 24-HOUR) 10-240 MG 24 hr tablet, Take 1 tablet by mouth daily., Disp: , Rfl:    LORazepam  (ATIVAN ) 0.5 MG tablet, Take 1 tablet (0.5 mg total) by mouth 2 (two) times daily as needed for anxiety., Disp: 10 tablet, Rfl: 1   Magnesium 500 MG TABS, Take by mouth., Disp: , Rfl:    methylPREDNISolone  (MEDROL  DOSEPAK) 4 MG TBPK tablet, Please take per packaging instructions., Disp: 21 tablet, Rfl: 1   Probiotic Product (PROBIOTIC MATURE ADULT) CAPS, Take by mouth., Disp: , Rfl:    topiramate  (TOPAMAX ) 25 MG tablet, Take 1 tablet (25 mg total) by mouth 2 (two) times daily., Disp: 180 tablet, Rfl: 3   Turmeric 500 MG CAPS, Take by mouth., Disp: , Rfl:    Zinc Acetate 25 MG CAPS, Take by mouth., Disp: , Rfl:    albuterol  (VENTOLIN  HFA) 108 (90 Base) MCG/ACT inhaler, Inhale 2 puffs into the lungs every 4 (four) hours as needed., Disp: 1 each, Rfl: 1  Allergies  Allergen Reactions   Bee Venom Anaphylaxis, Itching and Swelling   Shellfish Allergy Shortness Of Breath and Other (See Comments)   Codeine Nausea Only and Other (See Comments)    Other Reaction(s): GI Intolerance   Gabapentin Hives and Other (See Comments)   Interferons Other (See Comments)    Chest pains, burning trouble breathing itching   Acetaminophen  Other (See Comments)    Elevates live enzymes   Flagyl [Metronidazole] Other (See Comments)    Migraines    Gluten Meal Diarrhea and Other (See Comments)    Stomach pain    Interferon Alfa-2a Hives   Iodine    Lactose     Other reaction(s): Unknown   Lamotrigine Hives   Latex    Oseltamivir Other (See Comments)   ROS neg/noncontributory except as noted HPI/below      Objective:     BP (!) 162/98 Comment: having a lot coffee.  Pulse 77   Temp (!) 97.3 F (36.3 C) (Temporal)   Ht 5' 6 (1.676 m)   Wt 216 lb 9.6 oz (98.2 kg)   LMP   (LMP Unknown)   SpO2 98%   BMI 34.96 kg/m  Wt Readings from Last 3 Encounters:  06/30/24 216 lb 9.6 oz (98.2 kg)  05/23/24 219 lb 6.4 oz (99.5 kg)  05/20/24 217 lb (98.4 kg)    Physical Exam GENERAL: Well developed, well nourished, no acute distress. HEAD EYES EARS NOSE THROAT: Normocephalic, atraumatic, conjunctiva not injected, sclera nonicteric, eardrum normal, no cerumen. OP normal.  No TTP L jaw.  No  jaw clicks, no facial swelling/tenderness NECK: Supple, no thyromegaly, no cervical adenopathy EXTREMITIES: No edema. MUSCULOSKELETAL: No gross abnormalities. NEUROLOGICAL: Alert and oriented x3, cranial nerves II through XII intact. PSYCHIATRIC: Normal mood, good eye contact.       Assessment & Plan:  Jaw pain  Left ear pain  Mild intermittent asthma without complication  Other orders -     Albuterol  Sulfate HFA; Inhale 2 puffs into the lungs every 4 (four) hours as needed.  Dispense: 1 each; Refill: 1 -     methylPREDNISolone ; Please take per packaging instructions.  Dispense: 21 tablet; Refill: 1    Assessment and Plan Assessment & Plan Left-sided trigeminal neuralgia with recurrent jaw and ear pain   Recurrent left-sided jaw and ear pain, described as a crushing and screw-down sensation, correlates with seasonal allergies and dental procedures. This suggests possible trigeminal neuralgia or nerve irritation triggered by these factors. Examination shows no signs of infection or abscess. Differential diagnosis includes nerve irritation or inflammation due to dental procedures or allergies. Medrol  Dosepak is prescribed for flare-ups, and diclofenac  gel is recommended for topical anti-inflammatory effect during dental visits. She is advised to track episodes to identify patterns and potential triggers. The potential use of Claritin D for allergy-related inflammation is discussed.  Left-sided tinnitus and sensorineural hearing loss   Chronic left-sided tinnitus and  sensorineural hearing loss may relate to past dental procedures and nerve irritation. No acute changes are noted. Symptoms will continue to be monitored, and any changes tracked.  Seasonal allergic rhinitis   Seasonal allergies, with increased symptoms in spring and fall, may contribute to nerve irritation and inflammation, potentially exacerbating trigeminal neuralgia symptoms. Consideration is given to using Claritin D for allergy-related inflammation.  Asthma   Management is discussed in the context of medication refills, and the albuterol  inhaler is refilled.     Return if symptoms worsen or fail to improve.  Jenkins CHRISTELLA Carrel, MD

## 2024-07-20 ENCOUNTER — Other Ambulatory Visit

## 2024-07-20 DIAGNOSIS — E559 Vitamin D deficiency, unspecified: Secondary | ICD-10-CM | POA: Diagnosis not present

## 2024-07-20 DIAGNOSIS — G35D Multiple sclerosis, unspecified: Secondary | ICD-10-CM | POA: Diagnosis not present

## 2024-07-20 DIAGNOSIS — E2839 Other primary ovarian failure: Secondary | ICD-10-CM | POA: Diagnosis not present

## 2024-07-20 DIAGNOSIS — Z79899 Other long term (current) drug therapy: Secondary | ICD-10-CM

## 2024-07-24 NOTE — Progress Notes (Signed)
 Osteopenia-needs to take 1000iu/d of vitamin D .  Needs to continue 600mg  calcium twice daily or if can get enough in diet, ok.  Also, needs to work on walking daily to try to prevent worsening to osteoporosis.  Repeat 2 years

## 2024-07-25 NOTE — Progress Notes (Signed)
 Pt has read results smk

## 2024-08-17 ENCOUNTER — Other Ambulatory Visit

## 2024-09-19 ENCOUNTER — Encounter: Payer: Self-pay | Admitting: Family Medicine

## 2024-09-20 ENCOUNTER — Encounter: Payer: Self-pay | Admitting: Family Medicine

## 2024-10-18 ENCOUNTER — Other Ambulatory Visit (HOSPITAL_BASED_OUTPATIENT_CLINIC_OR_DEPARTMENT_OTHER)

## 2025-05-23 ENCOUNTER — Encounter: Admitting: Family Medicine

## 2025-05-24 ENCOUNTER — Encounter: Admitting: Family Medicine
